# Patient Record
Sex: Female | Born: 1975 | Race: White | Hispanic: No | Marital: Married | Smoking: Former smoker
Health system: Southern US, Community
[De-identification: ages and names within clinical notes are randomized; demographics above are authoritative.]

## PROBLEM LIST (undated history)

## (undated) DIAGNOSIS — R319 Hematuria, unspecified: Secondary | ICD-10-CM

## (undated) DIAGNOSIS — M069 Rheumatoid arthritis, unspecified: Secondary | ICD-10-CM

## (undated) DIAGNOSIS — T7840XA Allergy, unspecified, initial encounter: Secondary | ICD-10-CM

## (undated) DIAGNOSIS — K9041 Non-celiac gluten sensitivity: Secondary | ICD-10-CM

## (undated) DIAGNOSIS — M1990 Unspecified osteoarthritis, unspecified site: Secondary | ICD-10-CM

## (undated) DIAGNOSIS — E039 Hypothyroidism, unspecified: Secondary | ICD-10-CM

## (undated) DIAGNOSIS — F419 Anxiety disorder, unspecified: Secondary | ICD-10-CM

## (undated) HISTORY — DX: Unspecified osteoarthritis, unspecified site: M19.90

## (undated) HISTORY — PX: ESOPHAGOGASTRODUODENOSCOPY: SHX1529

## (undated) HISTORY — DX: Anxiety disorder, unspecified: F41.9

## (undated) HISTORY — PX: WISDOM TOOTH EXTRACTION: SHX21

## (undated) HISTORY — DX: Hypothyroidism, unspecified: E03.9

## (undated) HISTORY — DX: Rheumatoid arthritis, unspecified: M06.9

## (undated) HISTORY — PX: COSMETIC SURGERY: SHX468

## (undated) HISTORY — DX: Non-celiac gluten sensitivity: K90.41

## (undated) HISTORY — DX: Allergy, unspecified, initial encounter: T78.40XA

---

## 2017-09-05 ENCOUNTER — Encounter: Payer: Self-pay | Admitting: Family Medicine

## 2017-09-05 ENCOUNTER — Ambulatory Visit: Payer: BLUE CROSS/BLUE SHIELD | Admitting: Family Medicine

## 2017-09-05 VITALS — BP 110/70 | HR 74 | Temp 98.5°F | Resp 16 | Ht 64.0 in | Wt 170.6 lb

## 2017-09-05 DIAGNOSIS — E039 Hypothyroidism, unspecified: Secondary | ICD-10-CM | POA: Diagnosis not present

## 2017-09-05 DIAGNOSIS — M069 Rheumatoid arthritis, unspecified: Secondary | ICD-10-CM | POA: Insufficient documentation

## 2017-09-05 DIAGNOSIS — F419 Anxiety disorder, unspecified: Secondary | ICD-10-CM | POA: Diagnosis not present

## 2017-09-05 MED ORDER — BUPROPION HCL ER (XL) 150 MG PO TB24: 150.0000 mg | ORAL_TABLET | Freq: Every day | ORAL | 3 refills | Status: DC

## 2017-09-05 NOTE — Assessment & Plan Note (Signed)
Stable wellbutrin refilled rto cpe

## 2017-09-05 NOTE — Progress Notes (Signed)
Patient ID: Marcia Rodgers, female    DOB: 18-Jul-1976  Age: 42 y.o. MRN: [id number]    Subjective:  Subjective  HPI Marcia Rodgers present to establish.  She needs a refill on the wellbutrin.  She also has a hx of hypothyroidism. She sees gyn.    Review of Systems  Constitutional: Negative for activity change, appetite change, fatigue and unexpected weight change.  Respiratory: Negative for cough and shortness of breath.   Cardiovascular: Negative for chest pain and palpitations.  Psychiatric/Behavioral: Negative for behavioral problems and dysphoric mood. The patient is not nervous/anxious.     History Past Medical History:  Diagnosis Date  . Allergy   . Anxiety   . Arthritis   . Gluten intolerance   . Hypothyroidism   . Rheumatoid arthritis (HCC)     She has a past surgical history that includes Cosmetic surgery.   Her family history includes Cancer in her maternal grandmother; Heart attack in her maternal grandfather; Hyperlipidemia in her maternal grandfather; Stroke in her maternal grandfather.She reports that she has quit smoking. she has never used smokeless tobacco. She reports that she drinks alcohol. She reports that she does not use drugs.  Current Outpatient Medications on File Prior to Visit  Medication Sig Dispense Refill  . drospirenone-ethinyl estradiol (LORYNA) 3-0.02 MG tablet Take 1 tablet by mouth daily.    Marland Kitchen levothyroxine (SYNTHROID, LEVOTHROID) 88 MCG tablet Take 1 tablet by mouth daily. [facility]    . valACYclovir (VALTREX) 1000 MG tablet Take 1 tablet by mouth daily.  2   No current facility-administered medications on file prior to visit.      Objective:  Objective  Physical Exam  Constitutional: She is oriented to person, place, and time. She appears well-developed and well-nourished.  HENT:  Head: Normocephalic and atraumatic.  Eyes: Conjunctivae and EOM are normal.  Neck: Normal range of motion. Neck supple. No JVD present.  Carotid bruit is not present. No thyromegaly present.  Cardiovascular: Normal rate, regular rhythm and normal heart sounds.  No murmur heard. Pulmonary/Chest: Effort normal and breath sounds normal. No respiratory distress. She has no wheezes. She has no rales. She exhibits no tenderness.  Musculoskeletal: She exhibits no edema.  Neurological: She is alert and oriented to person, place, and time.  Psychiatric: She has a normal mood and affect. Her behavior is normal. Thought content normal.  Nursing note and vitals reviewed.  BP 110/70 (BP Location: Left Arm, Cuff Size: Normal)   Pulse 74   Temp 98.5 F (36.9 C) (Oral)   Resp 16   Ht 5\' 4"  (1.626 m)   Wt 170 lb 9.6 oz (77.4 kg)   LMP 08/05/2017   SpO2 94%   BMI 29.28 kg/m  Wt Readings from Last 3 Encounters:  09/05/17 170 lb 9.6 oz (77.4 kg)     No results found for: WBC, HGB, HCT, PLT, GLUCOSE, CHOL, TRIG, HDL, LDLDIRECT, LDLCALC, ALT, AST, NA, K, CL, CREATININE, BUN, CO2, TSH, PSA, INR, GLUF, HGBA1C, MICROALBUR  Patient was never admitted.   Assessment & Plan:  Plan  I have changed 11/03/17 Marcia Rodgers's buPROPion. I am also having her maintain her drospirenone-ethinyl estradiol, levothyroxine, and valACYclovir.  Meds ordered this encounter  Medications  . buPROPion (WELLBUTRIN XL) 150 MG 24 hr tablet    Sig: Take 1 tablet (150 mg total) by mouth daily.    Dispense:  90 tablet    Refill:  3    Problem List Items  Addressed This Visit      Unprioritized   Anxiety - Primary    Stable wellbutrin refilled rto cpe      Relevant Medications   buPROPion (WELLBUTRIN XL) 150 MG 24 hr tablet   Hypothyroidism    Check tsh con't synthroid      Relevant Medications   levothyroxine (SYNTHROID, LEVOTHROID) 88 MCG tablet   Other Relevant Orders   TSH   Rheumatoid arthritis (HCC)      Follow-up: Return if symptoms worsen or fail to improve, for annual exam, fasting.  Donato Schultz, DO

## 2017-09-05 NOTE — Assessment & Plan Note (Signed)
Check tsh con't synthroid 

## 2017-09-05 NOTE — Patient Instructions (Signed)

## 2017-09-06 LAB — TSH: TSH: 1.88 u[IU]/mL (ref 0.35–4.50)

## 2017-09-08 ENCOUNTER — Encounter: Payer: Self-pay | Admitting: Family Medicine

## 2017-11-16 ENCOUNTER — Encounter: Payer: Self-pay | Admitting: Family Medicine

## 2017-11-17 NOTE — Telephone Encounter (Signed)
Her tsh was 1.88---- that is low normal range --- if we increase it she will be more hyperthyroid

## 2017-11-18 ENCOUNTER — Telehealth: Payer: Self-pay | Admitting: Family Medicine

## 2017-11-18 ENCOUNTER — Encounter: Payer: Self-pay | Admitting: Family Medicine

## 2017-11-18 MED ORDER — LEVOTHYROXINE SODIUM 88 MCG PO TABS: 88.0000 ug | ORAL_TABLET | Freq: Every day | ORAL | 1 refills | Status: DC

## 2017-11-18 NOTE — Telephone Encounter (Signed)
Levothyroxine refill clarification question from the pharmacy.  Misty from [employer] [phone] press 71.   Pharmacy needing to verify if the dr wanted the levothyroxine or the SR that had normally been filled.     Thanks.  Dr. Zola Button

## 2017-11-18 NOTE — Telephone Encounter (Signed)
Copied from CRM [id number]. Topic: Quick Communication - See Telephone Encounter >> Nov 18, 2017  9:24 AM Arlyss Gandy, NT wrote: CRM for notification. See Telephone encounter for: 11/18/17. Misty from [employer] [phone] press 71. Pharmacy needing to verify if the dr wanted the levothyroxine (SYNTHROID, LEVOTHROID) or the SR that had normally been filled.

## 2017-11-18 NOTE — Telephone Encounter (Signed)
See MyChart message from 11/16/2017.

## 2017-11-22 ENCOUNTER — Telehealth: Payer: Self-pay | Admitting: Family Medicine

## 2017-11-22 NOTE — Telephone Encounter (Signed)
Duplicate message. 

## 2017-11-22 NOTE — Telephone Encounter (Signed)
Can you please review the last email for patient.  It looks like she would like a sustained release.

## 2017-11-22 NOTE — Telephone Encounter (Signed)
Copied from CRM [id number]. Topic: Quick Communication - See Telephone Encounter >> Nov 18, 2017  9:24 AM Arlyss Gandy, NT wrote: CRM for notification. See Telephone encounter for: 11/18/17. Misty from [employer] [phone] press 71. Pharmacy needing to verify if the dr wanted the levothyroxine (SYNTHROID, LEVOTHROID) or the SR that had normally been filled.  >> Nov 22, 2017 12:29 PM Lelon Frohlich, RMA wrote: Pharmacy called back about levothyroxine  >> Nov 22, 2017  2:44 PM Rudi Coco, NT wrote: Pt. Calling back to check on med levothyroxine (SYNTHROID, LEVOTHROID) 88 MCG tablet [[id number]] .

## 2017-11-22 NOTE — Telephone Encounter (Signed)
He should stay on the one she has been taking

## 2017-11-23 MED ORDER — NONFORMULARY OR COMPOUNDED ITEM: 1 refills | Status: DC

## 2017-11-23 NOTE — Telephone Encounter (Signed)
Contacted pharmacy.  rx should say levothyroxine SR .  Its a compounded medication.  Spoke with pharmacist.  Rx fixed in list for future references.

## 2017-12-06 ENCOUNTER — Ambulatory Visit: Payer: BLUE CROSS/BLUE SHIELD | Admitting: Family Medicine

## 2017-12-06 ENCOUNTER — Encounter: Payer: Self-pay | Admitting: Family Medicine

## 2017-12-06 VITALS — BP 102/66 | HR 76 | Temp 98.5°F | Resp 16 | Ht 64.0 in | Wt 171.0 lb

## 2017-12-06 DIAGNOSIS — M0609 Rheumatoid arthritis without rheumatoid factor, multiple sites: Secondary | ICD-10-CM

## 2017-12-06 NOTE — Progress Notes (Signed)
Patient ID: Marcia Rodgers, female   DOB: 1975/09/15, 42 y.o.   MRN: [id number]     Subjective:  I acted as a Education administrator for Dr. Carollee Herter.  Guerry Bruin, [place]   Patient ID: Marcia Rodgers, female    DOB: 06-Aug-1976, 42 y.o.   MRN: [id number]  Chief Complaint  Patient presents with  . referral to rheumatology    HPI  Patient is in today for referral to rheumatologist.-- she has a hx of reactive rheumatoid arthritis ---- it usually starts in her R ankle and knee and she was seeing a rheum in [place].  She will have the records sent [facility].  She has already contacted dr Valora Piccolo office and they just need a referral.   Patient Care Team: Carollee Herter, Alferd Apa, DO as PCP - General (Family Medicine) Aggie Moats, PA-C (Obstetrics and Gynecology)   Past Medical History:  Diagnosis Date  . Allergy   . Anxiety   . Arthritis   . Gluten intolerance   . Hypothyroidism   . Rheumatoid arthritis [facility])     Past Surgical History:  Procedure Laterality Date  . COSMETIC SURGERY     breast, arms, around stomach    Family History  Problem Relation Age of Onset  . Cancer Maternal Grandmother   . Heart attack Maternal Grandfather   . Hyperlipidemia Maternal Grandfather   . Stroke Maternal Grandfather     Social History   Socioeconomic History  . Marital status: Married    Spouse name: Not on file  . Number of children: Not on file  . Years of education: Not on file  . Highest education level: Bachelor's degree (e.g., BA, AB, BS)  Occupational History  . Occupation: English as a second language teacher  Social Needs  . Financial resource strain: Not on file  . Food insecurity:    Worry: Not on file    Inability: Not on file  . Transportation needs:    Medical: Not on file    Non-medical: Not on file  Tobacco Use  . Smoking status: Former Research scientist (life sciences)  . Smokeless tobacco: Never Used  Substance and Sexual Activity  . Alcohol use: Yes  . Drug use: No  . Sexual activity: Not on file  Lifestyle  . Physical activity:      Days per week: Not on file    Minutes per session: Not on file  . Stress: Not on file  Relationships  . Social connections:    Talks on phone: Not on file    Gets together: Not on file    Attends religious service: Not on file    Active member of club or organization: Not on file    Attends meetings of clubs or organizations: Not on file    Relationship status: Not on file  . Intimate partner violence:    Fear of current or ex partner: Not on file    Emotionally abused: Not on file    Physically abused: Not on file    Forced sexual activity: Not on file  Other Topics Concern  . Not on file  Social History Narrative   Exercise-no    Outpatient Medications Prior to Visit  Medication Sig Dispense Refill  . buPROPion (WELLBUTRIN XL) 150 MG 24 hr tablet Take 1 tablet (150 mg total) by mouth daily. 90 tablet 3  . drospirenone-ethinyl estradiol (LORYNA) 3-0.02 MG tablet Take 1 tablet by mouth daily.    . NONFORMULARY OR COMPOUNDED ITEM Levothyroxine SR 73mg 90 each 1  . valACYclovir (  VALTREX) 1000 MG tablet Take 1 tablet by mouth daily.  2   No facility-administered medications prior to visit.     No Known Allergies  Review of Systems  Constitutional: Negative for chills, fever and malaise/fatigue.  HENT: Negative for congestion and hearing loss.   Eyes: Negative for discharge.  Respiratory: Negative for cough, sputum production and shortness of breath.   Cardiovascular: Positive for leg swelling. Negative for chest pain and palpitations.  Gastrointestinal: Negative for abdominal pain, blood in stool, constipation, diarrhea, heartburn, nausea and vomiting.  Genitourinary: Negative for dysuria, frequency, hematuria and urgency.  Musculoskeletal: Positive for joint pain. Negative for back pain, falls and myalgias.  Skin: Negative for rash.  Neurological: Negative for dizziness, sensory change, loss of consciousness, weakness and headaches.  Endo/Heme/Allergies: Negative for  environmental allergies. Does not bruise/bleed easily.  Psychiatric/Behavioral: Negative for depression and suicidal ideas. The patient is not nervous/anxious and does not have insomnia.        Objective:    Physical Exam  Constitutional: She is oriented to person, place, and time. She appears well-developed and well-nourished.  HENT:  Head: Normocephalic and atraumatic.  Eyes: Conjunctivae and EOM are normal.  Neck: Normal range of motion. Neck supple. No JVD present. Carotid bruit is not present. No thyromegaly present.  Cardiovascular: Normal rate, regular rhythm and normal heart sounds.  No murmur heard. Pulmonary/Chest: Effort normal and breath sounds normal. No respiratory distress. She has no wheezes. She has no rales. She exhibits no tenderness.  Musculoskeletal: She exhibits no edema.  No swelling today--- much better today per pt  Neurological: She is alert and oriented to person, place, and time.  Psychiatric: She has a normal mood and affect.  Nursing note and vitals reviewed.   BP 102/66 (Cuff Size: Normal)   Pulse 76   Temp 98.5 F (36.9 C) (Oral)   Resp 16   Ht 5' 4"  (1.626 m)   Wt 171 lb (77.6 kg)   SpO2 98%   BMI 29.35 kg/m  Wt Readings from Last 3 Encounters:  12/06/17 171 lb (77.6 kg)  09/05/17 170 lb 9.6 oz (77.4 kg)   BP Readings from Last 3 Encounters:  12/06/17 102/66  09/05/17 110/70      There is no immunization history on file for this patient.  Health Maintenance  Topic Date Due  . HIV Screening  01/09/1992  . TETANUS/TDAP  01/09/1996  . PAP SMEAR  01/08/1998  . INFLUENZA VACCINE  03/30/2028 (Originally 03/30/2018)    Lab Results  Component Value Date   TSH 1.88 09/05/2017    Lab Results  Component Value Date   TSH 1.88 09/05/2017   No results found for: WBC, HGB, HCT, MCV, PLT No results found for: NA, K, CHLORIDE, CO2, GLUCOSE, BUN, CREATININE, BILITOT, ALKPHOS, AST, ALT, PROT, ALBUMIN, CALCIUM, ANIONGAP, EGFR, GFR No results  found for: CHOL No results found for: HDL No results found for: LDLCALC No results found for: TRIG No results found for: CHOLHDL No results found for: HGBA1C       Assessment & Plan:   Problem List Items Addressed This Visit    None      I am having [place] maintain her drospirenone-ethinyl estradiol, valACYclovir, buPROPion, and NONFORMULARY OR COMPOUNDED ITEM.  No orders of the defined types were placed in this encounter.   CMA served as Education administrator during this visit. History, Physical and Plan performed by medical provider. Documentation and orders reviewed and attested to.  Kem Boroughs  [place], CMA

## 2017-12-06 NOTE — Patient Instructions (Signed)
Rheumatoid Arthritis Rheumatoid arthritis (RA) is a long-term (chronic) disease that causes inflammation in your joints. RA may start slowly. It usually affects the small joints of the hands and feet. Usually, the same joints are affected on both sides of your body. Inflammation from RA can also affect other parts of your body, including your heart, eyes, or lungs. RA is an autoimmune disease. That means that your body's defense system (immune system) mistakenly attacks healthy body tissues. There is no cure for RA, but medicines can help your symptoms and halt or slow down the progression of the disease. What are the causes? The exact cause of RA is not known. What increases the risk? This condition is more likely to develop in:  Women.  People who have a family history of RA or other autoimmune diseases.  What are the signs or symptoms? Symptoms of this condition vary from person to person. Symptoms usually start gradually. They are often worse in the morning. The first symptom may be morning stiffness that lasts longer than 30 minutes. As RA progresses, symptoms may include:  Pain, stiffness, swelling, warmth, and tenderness in joints on both sides of your body.  Loss of energy.  Loss of appetite.  Weight loss.  Low-grade fever.  Dry eyes and dry mouth.  Firm lumps (rheumatoid nodules) that grow beneath your skin in areas such as your forearm bones near your elbows and on your hands.  Changes in the appearance of joints (deformity) and loss of joint function.  Symptoms of RA often come and go. Sometimes, symptoms get worse for a period of time. These are called flares. How is this diagnosed? This condition is diagnosed based on your symptoms, medical history, and physical exam. You may have X-rays or MRI to check for the type of joint changes that are caused by RA. You may also have blood tests to look for:  Proteins (antibodies) that your immune system may make if you have RA.  They include rheumatoid factor (RF) and anti-CCP. ? When blood tests show these proteins, you are said to have "seropositive RA." ? When blood tests do not show these proteins, you may have "seronegative RA."  Inflammation in your blood.  A low number of red blood cells (anemia).  How is this treated? The goals of treatment are to relieve pain, reduce inflammation, and slow down or stop joint damage and disability. Treatment may include:  Lifestyle changes. It is important to rest, eat a healthy diet, and exercise.  Medicines. Your health care provider may adjust your medicines every 3 months until treatment goals are reached. Common medicines include: ? Pain relievers (analgesics). ? Corticosteroids and NSAIDs to reduce inflammation. ? Disease-modifying antirheumatic drugs (DMARDs) to try to slow the course of the disease. ? Biologic response modifiers to reduce inflammation and damage.  Physical therapy and occupational therapy.  Surgery, if you have severe joint damage. Joint replacement or fusing of joints may be needed.  Your health care provider will work with you to identify the best treatment option for you based on assessment of the overall disease activity in your body. Follow these instructions at home:  Take over-the-counter and prescription medicines only as told by your health care provider.  Start an exercise program as told by your health care provider.  Rest when you are having a flare.  Return to your normal activities as told by your health care provider. Ask your health care provider what activities are safe for you.  Keep all   follow-up visits as told by your health care provider. This is important. Where to find more information:  American College of Rheumatology: www.rheumatology.org  Arthritis Foundation: www.arthritis.org Contact a health care provider if:  You have a flare-up of RA symptoms.  You have a fever.  You have side effects from your  medicines. Get help right away if:  You have chest pain.  You have trouble breathing.  You quickly develop a hot, painful joint that is more severe than your usual joint aches. This information is not intended to replace advice given to you by your health care provider. Make sure you discuss any questions you have with your health care provider. Document Released: 08/13/2000 Document Revised: 01/20/2016 Document Reviewed: 05/29/2015 Elsevier Interactive Patient Education  2018 Elsevier Inc.   

## 2017-12-07 LAB — CBC WITH DIFFERENTIAL/PLATELET
BASOS ABS: 0.1 10*3/uL (ref 0.0–0.1)
Basophils Relative: 1.2 % (ref 0.0–3.0)
EOS ABS: 0.2 10*3/uL (ref 0.0–0.7)
Eosinophils Relative: 2.8 % (ref 0.0–5.0)
HEMATOCRIT: 38.7 % (ref 36.0–46.0)
HEMOGLOBIN: 13.4 g/dL (ref 12.0–15.0)
LYMPHS PCT: 34.6 % (ref 12.0–46.0)
Lymphs Abs: 2.1 10*3/uL (ref 0.7–4.0)
MCHC: 34.7 g/dL (ref 30.0–36.0)
MCV: 86.3 fl (ref 78.0–100.0)
Monocytes Absolute: 0.5 10*3/uL (ref 0.1–1.0)
Monocytes Relative: 8.4 % (ref 3.0–12.0)
Neutro Abs: 3.3 10*3/uL (ref 1.4–7.7)
Neutrophils Relative %: 53 % (ref 43.0–77.0)
Platelets: 324 10*3/uL (ref 150.0–400.0)
RBC: 4.49 Mil/uL (ref 3.87–5.11)
RDW: 13 % (ref 11.5–15.5)
WBC: 6.2 10*3/uL (ref 4.0–10.5)

## 2017-12-07 LAB — HIGH SENSITIVITY CRP: CRP, High Sensitivity: 1.69 mg/L (ref 0.000–5.000)

## 2017-12-07 LAB — COMPREHENSIVE METABOLIC PANEL
ALBUMIN: 3.9 g/dL (ref 3.5–5.2)
ALK PHOS: 45 U/L (ref 39–117)
ALT: 18 U/L (ref 0–35)
AST: 13 U/L (ref 0–37)
BUN: 19 mg/dL (ref 6–23)
CALCIUM: 9.1 mg/dL (ref 8.4–10.5)
CO2: 30 meq/L (ref 19–32)
CREATININE: 1.08 mg/dL (ref 0.40–1.20)
Chloride: 102 mEq/L (ref 96–112)
GFR: 59.45 mL/min — ABNORMAL LOW (ref >60.00)
Glucose, Bld: 94 mg/dL (ref 70–99)
Potassium: 4.2 mEq/L (ref 3.5–5.1)
Sodium: 137 mEq/L (ref 135–145)
Total Bilirubin: 0.2 mg/dL (ref 0.2–1.2)
Total Protein: 7.1 g/dL (ref 6.0–8.3)

## 2017-12-07 LAB — SEDIMENTATION RATE: Sed Rate: 7 mm/hr (ref 0–20)

## 2017-12-11 LAB — ANTI-NUCLEAR AB-TITER (ANA TITER)

## 2017-12-11 LAB — ANA: ANA: POSITIVE — AB

## 2017-12-11 LAB — RHEUMATOID FACTOR: Rhuematoid fact SerPl-aCnc: <14 IU/mL (ref <14)

## 2018-06-14 ENCOUNTER — Telehealth: Payer: Self-pay | Admitting: Family Medicine

## 2018-06-14 MED ORDER — NONFORMULARY OR COMPOUNDED ITEM: 1 refills | Status: DC

## 2018-06-14 NOTE — Telephone Encounter (Signed)
Copied from CRM [id number]. Topic: Quick Communication - Rx Refill/Question >> Jun 14, 2018  9:58 AM Gaynelle Adu wrote: Medication: levothyroxine (SYNTHROID, LEVOTHROID) 88 MCG tablet  Has the patient contacted their pharmacy? Yes    Preferred Pharmacy (with phone number or street name): [facility] - [place], [place] - [address] [phone] (Phone) [phone] (Fax)  Patient is requesting a 90 day supply

## 2018-06-14 NOTE — Telephone Encounter (Signed)
Patient notified that rx was faxed.

## 2018-07-30 ENCOUNTER — Other Ambulatory Visit: Payer: Self-pay | Admitting: Family Medicine

## 2018-07-30 DIAGNOSIS — F419 Anxiety disorder, unspecified: Secondary | ICD-10-CM

## 2018-08-02 ENCOUNTER — Telehealth: Payer: Self-pay

## 2018-08-02 NOTE — Telephone Encounter (Signed)
Received PA for bupropion HCl ER 150mg  tablets- however Pt does not have active pharmacy benefits that I could locate.

## 2018-08-03 NOTE — Telephone Encounter (Signed)
Please leave message for [facility]

## 2018-08-03 NOTE — Telephone Encounter (Signed)
Patients last name has changed to [place]. That may be the reason why we cannot find info in computer.  Patient will come by to update her info.  Pharmacy resending prior [employer].

## 2018-08-07 NOTE — Telephone Encounter (Signed)
Pt states that the pharmacy states they have not received PA info from the office. Please advise.

## 2018-08-08 NOTE — Telephone Encounter (Signed)
Medication approved, called pharmacy and they stated that it went through for 30 tabs.  Left message on patients vm that medication is approved.  Approvedtoday  CaseId:[id number];Status:Approved;Review Type:Prior Auth;Coverage Start Date:07/09/2018;Coverage End Date:08/07/2021;  Key [id number]

## 2018-08-31 ENCOUNTER — Telehealth: Payer: Self-pay

## 2018-08-31 NOTE — Telephone Encounter (Signed)
TOC appt scheduled for 09/14/18

## 2018-08-31 NOTE — Telephone Encounter (Signed)
OK w me.  

## 2018-08-31 NOTE — Telephone Encounter (Signed)
Okay to schedule NP appt w/ Dr. [facility].  

## 2018-08-31 NOTE — Telephone Encounter (Signed)
Copied from CRM [id number]. Topic: Appointment Scheduling - Transfer of Care >> Aug 31, 2018 12:18 PM Floria Raveling A wrote: Pt is requesting to transfer FROM: [place]  Pt is requesting to transfer TO: [facility]  Reason for requested transfer: pt husband goes to dr Carmelia Roller and would like to go to the same provider.  She stated she has a lot of medical issues   Best number  [phone]  Send CRM to patient's current PCP (transferring FROM).

## 2018-08-31 NOTE — Telephone Encounter (Signed)
Please advise 

## 2018-08-31 NOTE — Telephone Encounter (Signed)
Ok with me 

## 2018-09-14 ENCOUNTER — Encounter: Payer: Self-pay | Admitting: Family Medicine

## 2018-09-14 ENCOUNTER — Ambulatory Visit: Payer: BLUE CROSS/BLUE SHIELD | Admitting: Family Medicine

## 2018-09-14 VITALS — BP 112/72 | HR 91 | Temp 98.1°F | Ht 64.0 in | Wt 168.5 lb

## 2018-09-14 DIAGNOSIS — E039 Hypothyroidism, unspecified: Secondary | ICD-10-CM | POA: Diagnosis not present

## 2018-09-14 DIAGNOSIS — F419 Anxiety disorder, unspecified: Secondary | ICD-10-CM | POA: Diagnosis not present

## 2018-09-14 DIAGNOSIS — L659 Nonscarring hair loss, unspecified: Secondary | ICD-10-CM | POA: Diagnosis not present

## 2018-09-14 LAB — TSH: TSH: 2.2 u[IU]/mL (ref 0.35–4.50)

## 2018-09-14 LAB — IBC PANEL
IRON: 61 ug/dL (ref 42–145)
Saturation Ratios: 16.7 % — ABNORMAL LOW (ref 20.0–50.0)
TRANSFERRIN: 261 mg/dL (ref 212.0–360.0)

## 2018-09-14 LAB — T4, FREE: Free T4: 0.97 ng/dL (ref 0.60–1.60)

## 2018-09-14 LAB — FERRITIN: FERRITIN: 49 ng/mL (ref 10.0–291.0)

## 2018-09-14 MED ORDER — BUPROPION HCL ER (XL) 150 MG PO TB24: 150.0000 mg | ORAL_TABLET | Freq: Every day | ORAL | 3 refills | Status: DC

## 2018-09-14 MED ORDER — NONFORMULARY OR COMPOUNDED ITEM: 3 refills | Status: DC

## 2018-09-14 NOTE — Patient Instructions (Addendum)
Give us 2-3 business days to get the results of your labs back.  Let us know if you need anything.  

## 2018-09-14 NOTE — Progress Notes (Signed)
Chief Complaint  Patient presents with  . New Patient (Initial Visit)    Subjective: Patient is a 43 y.o. female here for hypothyroid f/u.  Reports compliance with medication. Current symptoms include: fatigue, wt gain, hair thinning She believes her dose should be increased  Having hair thinning.  Has tried biotin in the past.  Is wondering whether it is due to thyroid issues or something else.  ROS:  Endocrine: As noted in HPI  Past Medical History:  Diagnosis Date  . Allergy   . Anxiety   . Arthritis   . Gluten intolerance   . Hypothyroidism   . Rheumatoid arthritis (HCC)     Objective: BP 112/72 (BP Location: Left Arm, Patient Position: Sitting, Cuff Size: Normal)   Pulse 91   Temp 98.1 F (36.7 C) (Oral)   Ht 5\' 4"  (1.626 m)   Wt 168 lb 8 oz (76.4 kg)   SpO2 97%   BMI 28.92 kg/m  General: Awake, appears stated age HEENT: MMM, EOMi  Heart: RRR, no lower extremity edema Neck: Symmetric, no masses or nodules noted Lungs: CTAB, no rales, wheezes or rhonchi. No accessory muscle use Psych: Age appropriate judgment and insight, normal affect and mood  Assessment and Plan: Hypothyroidism, unspecified type - Plan: TSH, IBC panel  Hair thinning - Plan: Ferritin, T4, free  Anxiety - Plan: buPROPion (WELLBUTRIN XL) 150 MG 24 hr tablet  Orders as above. She goes to a compounding pharmacy that is able to make off label dosing of the Synthroid.  She has been increased by 5-7 mcg in the past and would like to try this again.  We will check labs today and again in 6 weeks. The patient voiced understanding and agreement to the plan.  Jilda Roche [place], DO 09/14/18  11:43 AM

## 2018-09-28 ENCOUNTER — Encounter: Payer: Self-pay | Admitting: Family Medicine

## 2018-09-28 ENCOUNTER — Other Ambulatory Visit: Payer: Self-pay | Admitting: Family Medicine

## 2018-09-28 MED ORDER — NONFORMULARY OR COMPOUNDED ITEM: 3 refills | Status: DC

## 2018-11-02 ENCOUNTER — Ambulatory Visit: Payer: BLUE CROSS/BLUE SHIELD | Admitting: Family Medicine

## 2018-12-18 ENCOUNTER — Ambulatory Visit (INDEPENDENT_AMBULATORY_CARE_PROVIDER_SITE_OTHER): Payer: BLUE CROSS/BLUE SHIELD | Admitting: Family Medicine

## 2018-12-18 ENCOUNTER — Other Ambulatory Visit: Payer: Self-pay

## 2018-12-18 ENCOUNTER — Encounter: Payer: Self-pay | Admitting: Family Medicine

## 2018-12-18 DIAGNOSIS — E039 Hypothyroidism, unspecified: Secondary | ICD-10-CM | POA: Diagnosis not present

## 2018-12-18 NOTE — Progress Notes (Signed)
CC: Thyroid f/u  Subjective: Patient is a 43 y.o. female here for thyroid f/u. Due to outbreak, we are interacting via web portal for an electronic face-to-face visit. I verified patient's ID using 2 identifiers.   Seen in Jan of this year. Had some issues with hypothyroid s/s's so there was a small increase in her supp thyroid medication from 88 to 94 mcg. She did not do well on the initial change, so went down to 91 mcg. Fatigue is significantly improved as well as hair loss issue. Still having though. If thyroid levels in nml range, would be OK staying at dose but would be interested in trying 94 mcg/d if on lower limit of normal.    ROS: Endo: As noted in HPI  Past Medical History:  Diagnosis Date  . Allergy   . Anxiety   . Arthritis   . Gluten intolerance   . Hypothyroidism   . Rheumatoid arthritis (HCC)     Objective: No conversational dyspnea Age appropriate judgment and insight Nml affect and mood  Assessment and Plan: Hypothyroidism, unspecified type - Plan: T4, free, TSH  Check labs. Will maintain on 91 mcg/ if WNL, if on lower limits of nml, will increase to 94 mcg/d.  F/u in 6 weeks if dosage change, 6 mo if we leave the same.  The patient voiced understanding and agreement to the plan.  Jilda Roche [place], DO 12/18/18  8:00 AM

## 2019-01-12 ENCOUNTER — Other Ambulatory Visit: Payer: Self-pay | Admitting: Family Medicine

## 2019-01-12 ENCOUNTER — Encounter: Payer: Self-pay | Admitting: Family Medicine

## 2019-01-12 DIAGNOSIS — F419 Anxiety disorder, unspecified: Secondary | ICD-10-CM

## 2019-01-12 MED ORDER — BUPROPION HCL ER (XL) 150 MG PO TB24: 150.0000 mg | ORAL_TABLET | Freq: Every day | ORAL | 1 refills | Status: DC

## 2019-01-12 MED ORDER — NONFORMULARY OR COMPOUNDED ITEM: 3 refills | Status: DC

## 2019-01-12 NOTE — Telephone Encounter (Signed)
Does this prescription get printed out as given here in order?

## 2019-01-12 NOTE — Telephone Encounter (Signed)
Printed. Needs labs. Ty.

## 2019-01-12 NOTE — Telephone Encounter (Signed)
This pt transferred to [facility]

## 2019-01-15 ENCOUNTER — Other Ambulatory Visit: Payer: Self-pay

## 2019-01-15 ENCOUNTER — Other Ambulatory Visit (INDEPENDENT_AMBULATORY_CARE_PROVIDER_SITE_OTHER): Payer: BLUE CROSS/BLUE SHIELD

## 2019-01-15 DIAGNOSIS — E039 Hypothyroidism, unspecified: Secondary | ICD-10-CM

## 2019-01-15 LAB — TSH: TSH: 1.83 u[IU]/mL (ref 0.35–4.50)

## 2019-01-15 LAB — T4, FREE: Free T4: 0.92 ng/dL (ref 0.60–1.60)

## 2019-01-16 ENCOUNTER — Other Ambulatory Visit: Payer: Self-pay | Admitting: Family Medicine

## 2019-01-16 MED ORDER — NONFORMULARY OR COMPOUNDED ITEM: 0 refills | Status: DC

## 2019-02-21 ENCOUNTER — Encounter: Payer: Self-pay | Admitting: Family Medicine

## 2019-02-23 ENCOUNTER — Other Ambulatory Visit: Payer: Self-pay

## 2019-02-23 ENCOUNTER — Other Ambulatory Visit: Payer: BC Managed Care – PPO

## 2019-02-23 ENCOUNTER — Ambulatory Visit (INDEPENDENT_AMBULATORY_CARE_PROVIDER_SITE_OTHER): Payer: BC Managed Care – PPO | Admitting: Family Medicine

## 2019-02-23 ENCOUNTER — Encounter: Payer: Self-pay | Admitting: Family Medicine

## 2019-02-23 ENCOUNTER — Telehealth: Payer: Self-pay | Admitting: Family Medicine

## 2019-02-23 DIAGNOSIS — Z20822 Contact with and (suspected) exposure to covid-19: Secondary | ICD-10-CM

## 2019-02-23 DIAGNOSIS — Z20828 Contact with and (suspected) exposure to other viral communicable diseases: Secondary | ICD-10-CM | POA: Diagnosis not present

## 2019-02-23 NOTE — Progress Notes (Signed)
Chief Complaint  Patient presents with  . Follow-up    Covid Testing    Subjective: Patient is a 43 y.o. female here for COVID-19 exposure. Due to COVID-19 pandemic, we are interacting via web portal for an electronic face-to-face visit. I verified patient's ID using 2 identifiers. Patient agreed to proceed with visit via this method. Patient is at home, I am at office. Patient and I are present for visit.   6/19 the patient was exposed to an asymptomatic individual who ended up testing + for COVID-19. The individual tested positive the next day after spiking a fever. Pt is asymptomatic. Told she needs a letter stating she can return on 7/6 and a COVID-19 test. Husband needs letter as well.   ROS: Lungs: Denies SOB   Past Medical History:  Diagnosis Date  . Allergy   . Anxiety   . Arthritis   . Gluten intolerance   . Hypothyroidism   . Rheumatoid arthritis (HCC)     Objective: No conversational dyspnea Age appropriate judgment and insight Nml affect and mood  Assessment and Plan: Exposure to Covid-19 Virus - Plan: test, letter written, warnings about development of s/s's given.   F/u as originally scheduled.  The patient voiced understanding and agreement to the plan.  [place], DO 02/23/19  9:00 AM

## 2019-02-23 NOTE — Telephone Encounter (Signed)
Please schedule for testing 

## 2019-02-23 NOTE — Telephone Encounter (Signed)
Patient scheduled for today at 12:45 pm at [facility].  Testing protocol reviewed.

## 2019-02-27 ENCOUNTER — Encounter: Payer: Self-pay | Admitting: Family Medicine

## 2019-02-27 ENCOUNTER — Other Ambulatory Visit: Payer: Self-pay

## 2019-02-27 ENCOUNTER — Ambulatory Visit: Payer: BC Managed Care – PPO | Admitting: Family Medicine

## 2019-02-27 VITALS — BP 120/80 | HR 79 | Temp 98.3°F | Resp 16 | Ht 64.0 in | Wt 165.0 lb

## 2019-02-27 DIAGNOSIS — E039 Hypothyroidism, unspecified: Secondary | ICD-10-CM | POA: Diagnosis not present

## 2019-02-27 DIAGNOSIS — F419 Anxiety disorder, unspecified: Secondary | ICD-10-CM

## 2019-02-27 DIAGNOSIS — L659 Nonscarring hair loss, unspecified: Secondary | ICD-10-CM | POA: Diagnosis not present

## 2019-02-27 LAB — TSH: TSH: 2.81 u[IU]/mL (ref 0.35–4.50)

## 2019-02-27 LAB — IBC + FERRITIN
Ferritin: 42.4 ng/mL (ref 10.0–291.0)
Iron: 83 ug/dL (ref 42–145)
Saturation Ratios: 22.7 % (ref 20.0–50.0)
Transferrin: 261 mg/dL (ref 212.0–360.0)

## 2019-02-27 LAB — T4, FREE: Free T4: 0.81 ng/dL (ref 0.60–1.60)

## 2019-02-27 MED ORDER — BUPROPION HCL ER (SR) 200 MG PO TB12: 200.0000 mg | ORAL_TABLET | Freq: Two times a day (BID) | ORAL | 2 refills | Status: DC

## 2019-02-27 NOTE — Progress Notes (Signed)
Chief Complaint  Patient presents with  . Hypothyroidism    follow up    Subjective: Patient is a 43 y.o. female here for thyroid f/u.  Hypothyroidism Patient presents for follow-up of hypothyroidism.  Reports compliance with medication. Current symptoms include: fluctuating bowel movements only s/s's Denies: denies fatigue, weight changes, heat/cold intolerance, skin changes or CVS symptoms She believes her dose should be unchanged  Hair thinning This continues, though at a lesser rate. Her ferritin was found to be on the lower side of normal in the past and she was rec'd to take PO iron. She has not been compliant with this. No new topicals/hair products.  Anxiety Situation w pandemic has increased her anxiety. She was started back on bupropion 150 mg/d around 6 weeks ago. Reports feeling better, wishes she could increase the dose. No AE's, reports compliance.   ROS: Endo: No wt changes Psych: +anxiety  Past Medical History:  Diagnosis Date  . Allergy   . Anxiety   . Arthritis   . Gluten intolerance   . Hypothyroidism   . Rheumatoid arthritis (HCC)     Objective: BP 120/80 (BP Location: Left Arm, Patient Position: Sitting, Cuff Size: Normal)   Pulse 79   Temp 98.3 F (36.8 C) (Oral)   Resp 16   Ht 5\' 4"  (1.626 m)   Wt 165 lb (74.8 kg)   SpO2 99%   BMI 28.32 kg/m  General: Awake, appears stated age HEENT: EOMi Neck: Symmetric, supple, no thyromegaly Heart: RRR, no murmurs Lungs: CTAB, no rales, wheezes or rhonchi. No accessory muscle use Psych: Age appropriate judgment and insight, normal affect and mood  Assessment and Plan: Hypothyroidism, unspecified type - Plan: T4, free, TSH  Hair thinning - Plan: IBC + Ferritin  Anxiety - Increase dose of bupropion to 200 mg bid from 150 mg.   Orders as above. If on lower 1/2 of normal, will want to increase dose to 97 mcg. F/u pending results.  The patient voiced understanding and agreement to the  plan.  [place], DO 02/27/19  10:54 AM

## 2019-02-27 NOTE — Patient Instructions (Addendum)
Download [employer] as an Print production planner. It is also a web site.   Give [place] 2-3 business days to get the results of your labs back.    Let us know if you need anything.

## 2019-02-28 ENCOUNTER — Encounter: Payer: Self-pay | Admitting: Family Medicine

## 2019-03-01 LAB — NOVEL CORONAVIRUS, NAA: SARS-CoV-2, NAA: NOT DETECTED

## 2019-03-05 ENCOUNTER — Other Ambulatory Visit: Payer: Self-pay | Admitting: Family Medicine

## 2019-03-05 MED ORDER — NONFORMULARY OR COMPOUNDED ITEM: 0 refills | Status: DC

## 2019-04-16 ENCOUNTER — Other Ambulatory Visit: Payer: Self-pay

## 2019-04-17 ENCOUNTER — Other Ambulatory Visit: Payer: Self-pay | Admitting: Family Medicine

## 2019-04-17 ENCOUNTER — Encounter: Payer: Self-pay | Admitting: Family Medicine

## 2019-04-17 ENCOUNTER — Ambulatory Visit: Payer: BC Managed Care – PPO | Admitting: Family Medicine

## 2019-04-17 VITALS — BP 102/68 | HR 94 | Temp 97.1°F | Ht 64.0 in | Wt 167.0 lb

## 2019-04-17 DIAGNOSIS — L659 Nonscarring hair loss, unspecified: Secondary | ICD-10-CM | POA: Diagnosis not present

## 2019-04-17 DIAGNOSIS — E039 Hypothyroidism, unspecified: Secondary | ICD-10-CM | POA: Diagnosis not present

## 2019-04-17 LAB — TSH: TSH: 3.04 u[IU]/mL (ref 0.35–4.50)

## 2019-04-17 LAB — T4, FREE: Free T4: 0.92 ng/dL (ref 0.60–1.60)

## 2019-04-17 MED ORDER — LEVOTHYROXINE SODIUM 100 MCG PO TABS: 100.0000 ug | ORAL_TABLET | Freq: Every day | ORAL | 3 refills | Status: DC

## 2019-04-17 NOTE — Patient Instructions (Addendum)
Consider adding MiraLAX or Colace to take with the iron. I think that could help your GI issues.   Give [place] 2-3 business days to get the results of your labs back. If you are on the lower end of normal, we will increase your dose to 100 mcg/d.   Let us know if you need anything.

## 2019-04-17 NOTE — Progress Notes (Signed)
Chief Complaint  Patient presents with  . Follow-up    Subjective: Patient is a 43 y.o. female here for thyroid f/u.  Reports compliance with medication- Synthroid 97 mcg/d (specially compounded). Current symptoms include: fatigue, losing hair, constipation and anxiousness Denies: weight gain, feeling cold and cold intolerance, swelling, feeling excessive energy, tremulousness, palpitations, sweating and weight loss She believes her dose should be increased We had been increasing at 3-4 mcg increments due to hyperthyroid issues with higher dosing.   Still having hair loss. Unable to take daily Fe supp 2/2 constipation.   ROS: GI: +constipation  Past Medical History:  Diagnosis Date  . Allergy   . Anxiety   . Arthritis   . Gluten intolerance   . Hypothyroidism   . Rheumatoid arthritis (HCC)     Objective: BP 102/68 (BP Location: Left Arm, Patient Position: Sitting, Cuff Size: Normal)   Pulse 94   Temp (!) 97.1 F (36.2 C) (Oral)   Ht 5\' 4"  (1.626 m)   Wt 167 lb (75.8 kg)   SpO2 98%   BMI 28.67 kg/m  General: Awake, appears stated age HEENT: MMM, EOMi Heart: RRR, no murmurs Lungs: CTAB, no rales, wheezes or rhonchi. No accessory muscle use Abd: BS+, S, NT, ND Psych: Age appropriate judgment and insight, normal affect and mood  Assessment and Plan: Hypothyroidism, unspecified type - Plan: T4, free, TSH; will increase dosage if range is safe  Hair thinning - Plan: try taking MiraLAX or Colace w Fe supp, try to get daily supp in.   Reck thyroid in 7 weeks. The patient voiced understanding and agreement to the plan.  [place], DO 04/17/19  8:22 AM

## 2019-04-19 ENCOUNTER — Encounter: Payer: Self-pay | Admitting: Family Medicine

## 2019-04-20 ENCOUNTER — Other Ambulatory Visit: Payer: Self-pay | Admitting: Family Medicine

## 2019-04-23 ENCOUNTER — Other Ambulatory Visit: Payer: Self-pay | Admitting: Family Medicine

## 2019-04-23 MED ORDER — NONFORMULARY OR COMPOUNDED ITEM: 1 refills | Status: DC

## 2019-06-04 ENCOUNTER — Other Ambulatory Visit: Payer: Self-pay

## 2019-06-05 ENCOUNTER — Other Ambulatory Visit: Payer: Self-pay

## 2019-06-05 ENCOUNTER — Encounter: Payer: Self-pay | Admitting: Family Medicine

## 2019-06-05 ENCOUNTER — Ambulatory Visit (INDEPENDENT_AMBULATORY_CARE_PROVIDER_SITE_OTHER): Payer: BC Managed Care – PPO | Admitting: Family Medicine

## 2019-06-05 VITALS — BP 110/72 | HR 91 | Temp 96.2°F | Ht 64.0 in | Wt 170.0 lb

## 2019-06-05 DIAGNOSIS — L659 Nonscarring hair loss, unspecified: Secondary | ICD-10-CM | POA: Diagnosis not present

## 2019-06-05 DIAGNOSIS — E039 Hypothyroidism, unspecified: Secondary | ICD-10-CM

## 2019-06-05 LAB — TSH: TSH: 3.82 u[IU]/mL (ref 0.35–4.50)

## 2019-06-05 LAB — IBC + FERRITIN
Ferritin: 52.5 ng/mL (ref 10.0–291.0)
Iron: 69 ug/dL (ref 42–145)
Saturation Ratios: 19.6 % — ABNORMAL LOW (ref 20.0–50.0)
Transferrin: 252 mg/dL (ref 212.0–360.0)

## 2019-06-05 LAB — T4, FREE: Free T4: 0.88 ng/dL (ref 0.60–1.60)

## 2019-06-05 MED ORDER — BUPROPION HCL ER (SR) 200 MG PO TB12: 200.0000 mg | ORAL_TABLET | Freq: Two times a day (BID) | ORAL | 2 refills | Status: DC

## 2019-06-05 NOTE — Patient Instructions (Signed)
Give us 2-3 business days to get the results of your labs back.   Keep the diet clean and stay active.  Aim to do some physical exertion for 150 minutes per week. This is typically divided into 5 days per week, 30 minutes per day. The activity should be enough to get your heart rate up. Anything is better than nothing if you have time constraints.  Let us know if you need anything.  

## 2019-06-05 NOTE — Progress Notes (Signed)
Chief Complaint  Patient presents with  . Follow-up    Subjective: Patient is a 43 y.o. female here for hypothyroid f/u.  Hypothyroidism Patient presents for follow-up of hypothyroidism.  Reports compliance with medication- 100 mcg/Synthroid. Current symptoms include: losing hair Denies: denies fatigue, weight changes, heat/cold intolerance, bowel/skin changes or CVS symptoms She believes her dose should be unchanged  Still losing hair. Taking MV w Fe in it. Able to tolerate. Did note that mom has thin hair also.   ROS: Endo: As noted in HPI  Past Medical History:  Diagnosis Date  . Allergy   . Anxiety   . Arthritis   . Gluten intolerance   . Hypothyroidism   . Rheumatoid arthritis (HCC)     Objective: BP 110/72 (BP Location: Left Arm, Patient Position: Sitting, Cuff Size: Normal)   Pulse 91   Temp (!) 96.2 F (35.7 C) (Temporal)   Ht 5\' 4"  (1.626 m)   Wt 170 lb (77.1 kg)   SpO2 98%   BMI 29.18 kg/m  General: Awake, appears stated age HEENT: MMM, EOMi Heart: RRR, no LE edema Neck: No asymmetry, nodules, enlargement of thyroid Lungs: CTAB, no rales, wheezes or rhonchi. No accessory muscle use Skin: No hairless patches, no areas of alopecia Psych: Age appropriate judgment and insight, normal affect and mood  Assessment and Plan: Hypothyroidism, unspecified type - Plan: TSH, T4, free  Hair thinning - Plan: IBC + Ferritin  If T4 WNL, will keep dose at 100 mcg/d.  F/u in 6 mo for CPE or prn.  The patient voiced understanding and agreement to the plan.  [place], DO 06/05/19  8:11 AM

## 2019-06-06 ENCOUNTER — Other Ambulatory Visit: Payer: Self-pay | Admitting: Family Medicine

## 2019-06-06 ENCOUNTER — Encounter: Payer: Self-pay | Admitting: Family Medicine

## 2019-06-06 MED ORDER — NONFORMULARY OR COMPOUNDED ITEM: 1 refills | Status: DC

## 2019-09-12 ENCOUNTER — Other Ambulatory Visit: Payer: Self-pay

## 2019-09-12 ENCOUNTER — Encounter: Payer: Self-pay | Admitting: Family Medicine

## 2019-09-12 ENCOUNTER — Ambulatory Visit (INDEPENDENT_AMBULATORY_CARE_PROVIDER_SITE_OTHER): Payer: BC Managed Care – PPO | Admitting: Family Medicine

## 2019-09-12 DIAGNOSIS — E039 Hypothyroidism, unspecified: Secondary | ICD-10-CM

## 2019-09-12 MED ORDER — NONFORMULARY OR COMPOUNDED ITEM: 2 refills | Status: DC

## 2019-09-12 NOTE — Progress Notes (Signed)
Chief Complaint  Patient presents with  . Medication Refill    thyroid med refill///needs labs today    Subjective: Patient is a 44 y.o. female here for hypothyroidism f/u. Due to COVID-19 pandemic, we are interacting via web portal for an electronic face-to-face visit. I verified patient's ID using 2 identifiers. Patient agreed to proceed with visit via this method. Patient is at home, I am at office. Patient and I are present for visit.   Hypothyroidism Reports compliance with medication- Synthroid 100 mcg/d. Denies: denies fatigue, weight changes, heat/cold intolerance, bowel/skin changes or CVS symptoms She believes her dose should be unchanged  ROS: Endo: No wt loss  Past Medical History:  Diagnosis Date  . Allergy   . Anxiety   . Arthritis   . Gluten intolerance   . Hypothyroidism   . Rheumatoid arthritis (HCC)     Objective: No conversational dyspnea Age appropriate judgment and insight Nml affect and mood  Assessment and Plan: Hypothyroidism, unspecified type - Plan: TSH, T4, free  Refill med, f/u on labs. F/u in 6 mo for CPE or prn.  The patient voiced understanding and agreement to the plan.  Jilda Roche [place], DO 09/12/19  8:41 AM

## 2019-09-18 ENCOUNTER — Other Ambulatory Visit: Payer: Self-pay

## 2019-09-18 ENCOUNTER — Other Ambulatory Visit (INDEPENDENT_AMBULATORY_CARE_PROVIDER_SITE_OTHER): Payer: BC Managed Care – PPO

## 2019-09-18 DIAGNOSIS — E039 Hypothyroidism, unspecified: Secondary | ICD-10-CM

## 2019-09-18 LAB — T4, FREE: Free T4: 0.94 ng/dL (ref 0.60–1.60)

## 2019-09-18 LAB — TSH: TSH: 3.94 u[IU]/mL (ref 0.35–4.50)

## 2019-12-04 ENCOUNTER — Other Ambulatory Visit: Payer: Self-pay

## 2019-12-05 ENCOUNTER — Ambulatory Visit (INDEPENDENT_AMBULATORY_CARE_PROVIDER_SITE_OTHER): Payer: BC Managed Care – PPO | Admitting: Family Medicine

## 2019-12-05 ENCOUNTER — Encounter: Payer: Self-pay | Admitting: Family Medicine

## 2019-12-05 ENCOUNTER — Other Ambulatory Visit: Payer: Self-pay

## 2019-12-05 VITALS — BP 110/72 | HR 81 | Temp 96.7°F | Ht 64.0 in | Wt 161.2 lb

## 2019-12-05 DIAGNOSIS — E039 Hypothyroidism, unspecified: Secondary | ICD-10-CM

## 2019-12-05 DIAGNOSIS — Z0000 Encounter for general adult medical examination without abnormal findings: Secondary | ICD-10-CM

## 2019-12-05 LAB — COMPREHENSIVE METABOLIC PANEL
ALT: 12 U/L (ref 0–35)
AST: 11 U/L (ref 0–37)
Albumin: 3.8 g/dL (ref 3.5–5.2)
Alkaline Phosphatase: 67 U/L (ref 39–117)
BUN: 18 mg/dL (ref 6–23)
CO2: 26 mEq/L (ref 19–32)
Calcium: 8.9 mg/dL (ref 8.4–10.5)
Chloride: 105 mEq/L (ref 96–112)
Creatinine, Ser: 0.94 mg/dL (ref 0.40–1.20)
GFR: 65.02 mL/min (ref >60.00)
Glucose, Bld: 82 mg/dL (ref 70–99)
Potassium: 4.3 mEq/L (ref 3.5–5.1)
Sodium: 139 mEq/L (ref 135–145)
Total Bilirubin: 0.3 mg/dL (ref 0.2–1.2)
Total Protein: 7 g/dL (ref 6.0–8.3)

## 2019-12-05 LAB — LIPID PANEL
Cholesterol: 193 mg/dL (ref 0–200)
HDL: 66.4 mg/dL (ref >39.00)
LDL Cholesterol: 106 mg/dL — ABNORMAL HIGH (ref 0–99)
NonHDL: 126.29
Total CHOL/HDL Ratio: 3
Triglycerides: 103 mg/dL (ref 0.0–149.0)
VLDL: 20.6 mg/dL (ref 0.0–40.0)

## 2019-12-05 LAB — CBC
HCT: 38.3 % (ref 36.0–46.0)
Hemoglobin: 13 g/dL (ref 12.0–15.0)
MCHC: 33.8 g/dL (ref 30.0–36.0)
MCV: 84.9 fl (ref 78.0–100.0)
Platelets: 379 10*3/uL (ref 150.0–400.0)
RBC: 4.51 Mil/uL (ref 3.87–5.11)
RDW: 13.7 % (ref 11.5–15.5)
WBC: 7 10*3/uL (ref 4.0–10.5)

## 2019-12-05 LAB — TSH: TSH: 2.94 u[IU]/mL (ref 0.35–4.50)

## 2019-12-05 LAB — T4, FREE: Free T4: 0.93 ng/dL (ref 0.60–1.60)

## 2019-12-05 MED ORDER — NONFORMULARY OR COMPOUNDED ITEM: 3 refills | Status: DC

## 2019-12-05 MED ORDER — BUPROPION HCL ER (SR) 200 MG PO TB12: 200.0000 mg | ORAL_TABLET | Freq: Two times a day (BID) | ORAL | 2 refills | Status: DC

## 2019-12-05 NOTE — Progress Notes (Signed)
Chief Complaint  Patient presents with  . Annual Exam     Well Woman Marcia Rodgers is here for a complete physical.   Her last physical was >1 year ago.  Current diet: in general, a "not good" diet. Current exercise: walking, elliptical machine. Weight is down a little and she denies daytime fatigue. Seatbelt? Yes  Health Maintenance Pap/HPV- Yes Mammogram- No Tetanus- No HIV screening- Yes  Past Medical History:  Diagnosis Date  . Allergy   . Anxiety   . Arthritis   . Gluten intolerance   . Hypothyroidism   . Rheumatoid arthritis [facility])      Past Surgical History:  Procedure Laterality Date  . COSMETIC SURGERY     breast, arms, around stomach    Medications  Current Outpatient Medications on File Prior to Visit  Medication Sig Dispense Refill  . drospirenone-ethinyl estradiol (LORYNA) 3-0.02 MG tablet Take 1 tablet by mouth daily.      Allergies Allergies  Allergen Reactions  . Gluten Meal Other (See Comments)    Review of Systems: Constitutional:  no unexpected weight changes Eye:  no recent significant change in vision Ear/Nose/Mouth/Throat:  Ears:  no recent change in hearing Nose/Mouth/Throat:  no complaints of nasal congestion, no sore throat Cardiovascular: no chest pain Respiratory:  no shortness of breath Gastrointestinal:  no abdominal pain, no change in bowel habits GU:  Female: negative for dysuria or pelvic pain Musculoskeletal/Extremities:  no pain of the joints Integumentary (Skin/Breast):  no abnormal skin lesions reported Neurologic:  no headaches Endocrine:  denies fatigue Hematologic/Lymphatic:  No areas of easy bleeding  Exam BP 110/72 (BP Location: Left Arm, Patient Position: Sitting, Cuff Size: Normal)   Pulse 81   Temp (!) 96.7 F (35.9 C) (Temporal)   Ht 5\' 4"  (1.626 m)   Wt 161 lb 4 oz (73.1 kg)   SpO2 98%   BMI 27.68 kg/m  General:  well developed, well nourished, in no apparent distress Skin:  no significant moles,  warts, or growths Head:  no masses, lesions, or tenderness Eyes:  pupils equal and round, sclera anicteric without injection Ears:  canals without lesions, TMs shiny without retraction, no obvious effusion, no erythema Nose:  nares patent, septum midline, mucosa normal, and no drainage or sinus tenderness Throat/Pharynx:  lips and gingiva without lesion; tongue and uvula midline; non-inflamed pharynx; no exudates or postnasal drainage Neck: neck supple without adenopathy, thyromegaly, or masses Lungs:  clear to auscultation, breath sounds equal bilaterally, no respiratory distress Cardio:  regular rate and rhythm, no LE edema Abdomen:  abdomen soft, nontender; bowel sounds normal; no masses or organomegaly Genital: Defer to GYN Musculoskeletal:  symmetrical muscle groups noted without atrophy or deformity Extremities:  no clubbing, cyanosis, or edema, no deformities, no skin discoloration Neuro:  gait normal; deep tendon reflexes normal and symmetric Psych: well oriented with normal range of affect and appropriate judgment/insight  Assessment and Plan  Well adult exam - Plan: CBC, Comprehensive metabolic panel, Lipid panel  Hypothyroidism, unspecified type - Plan: TSH, T4, free   Well 44 y.o. female. Counseled on diet and exercise. Shared decision making performed regarding mammograms for breast cancer screening and limitations in younger females. She will hold off for now.  Other orders as above. Follow up in 6 mo. The patient voiced understanding and agreement to the plan.  [address] [place], DO 12/05/19 8:22 AM

## 2019-12-05 NOTE — Patient Instructions (Signed)
Give [place] 2-3 business days to get the results of your labs back.   Keep the diet clean and stay active.  I recommend the Tdap (tetanus shot) every 10 years. I would recommend getting yours no sooner than 2 weeks after your second covid shot.  Let us know if you need anything.

## 2020-06-04 ENCOUNTER — Other Ambulatory Visit: Payer: Self-pay

## 2020-06-04 ENCOUNTER — Ambulatory Visit: Payer: BC Managed Care – PPO | Admitting: Family Medicine

## 2020-06-04 ENCOUNTER — Encounter: Payer: Self-pay | Admitting: Family Medicine

## 2020-06-04 VITALS — BP 108/68 | HR 103 | Temp 98.5°F | Ht 64.0 in | Wt 161.4 lb

## 2020-06-04 DIAGNOSIS — E039 Hypothyroidism, unspecified: Secondary | ICD-10-CM | POA: Diagnosis not present

## 2020-06-04 DIAGNOSIS — B001 Herpesviral vesicular dermatitis: Secondary | ICD-10-CM

## 2020-06-04 DIAGNOSIS — J302 Other seasonal allergic rhinitis: Secondary | ICD-10-CM

## 2020-06-04 DIAGNOSIS — F419 Anxiety disorder, unspecified: Secondary | ICD-10-CM | POA: Diagnosis not present

## 2020-06-04 DIAGNOSIS — J3089 Other allergic rhinitis: Secondary | ICD-10-CM | POA: Insufficient documentation

## 2020-06-04 LAB — T4, FREE: Free T4: 1.3 ng/dL (ref 0.8–1.8)

## 2020-06-04 LAB — TSH: TSH: 4.37 mIU/L

## 2020-06-04 MED ORDER — LEVOCETIRIZINE DIHYDROCHLORIDE 5 MG PO TABS: 5.0000 mg | ORAL_TABLET | Freq: Every evening | ORAL | 2 refills | Status: AC

## 2020-06-04 MED ORDER — FLUTICASONE PROPIONATE 50 MCG/ACT NA SUSP: 2.0000 | Freq: Every day | NASAL | 2 refills | Status: DC

## 2020-06-04 NOTE — Progress Notes (Signed)
Chief Complaint  Patient presents with  . Follow-up    Subjective Marcia Rodgers presents for f/u anxiety/depression.  Pt is currently being treated with Wellbutrin 200 mg bid.  Having reflux s/s's after 2nd dosage over past several weeks.  Reports doing well since treatment. No thoughts of harming self or others. No self-medication with alcohol, prescription drugs or illicit drugs. Pt is not following with a counselor/psychologist.  Hypothyroidism Patient presents for follow-up of hypothyroidism.  Reports compliance with medication- levothyroxine 100 mcg/d. Current symptoms include: denies fatigue, weight changes, heat/cold intolerance, bowel/skin changes or CVS symptoms She believes her dose should be unchanged  +hx of seasonal allergies She has tried Claritin, Zyrtec, Allegra in past without relief. Unsure what she is allergic to. She has itchy/watery eyes, running nose, congestion, scratch throat, sinus pressure. No fevers or sob. Interested in seeing an allergist. Is not currently taking anything.   +hx of cold sores. She takes Valtrex prn. Unsure why she is having flare. Has had one in throat for the past 2 mo. No known inj. Unsure if it got better or came back. She has never been on suppressive Valtrex.   Past Medical History:  Diagnosis Date  . Allergy   . Anxiety   . Arthritis   . Gluten intolerance   . Hypothyroidism   . Rheumatoid arthritis (HCC)    Allergies as of 06/04/2020      Reactions   Gluten Meal Other (See Comments)      Medication List       Accurate as of June 04, 2020  8:27 AM. If you have any questions, ask your nurse or doctor.        buPROPion 200 MG 12 hr tablet Commonly known as: WELLBUTRIN SR Take 1 tablet (200 mg total) by mouth 2 (two) times daily.   fluticasone 50 MCG/ACT nasal spray Commonly known as: FLONASE Place 2 sprays into both nostrils daily. Started by: Sharlene Dory, DO   levocetirizine 5 MG tablet Commonly  known as: XYZAL Take 1 tablet (5 mg total) by mouth every evening. Started by: Sharlene Dory, DO   Loryna 3-0.02 MG tablet Generic drug: drospirenone-ethinyl estradiol Take 1 tablet by mouth daily.   NONFORMULARY OR COMPOUNDED ITEM Levothyroxine SR 100 mcg       Exam BP 108/68 (BP Location: Left Arm, Patient Position: Sitting, Cuff Size: Normal)   Pulse (!) 103   Temp 98.5 F (36.9 C) (Oral)   Ht 5\' 4"  (1.626 m)   Wt 161 lb 6 oz (73.2 kg)   SpO2 99%   BMI 27.70 kg/m  General:  well developed, well nourished, in no apparent distress Lungs:  CTAB. No respiratory distress HEENT: Ears neg b/l, nares patent w/o dc, MMM, tonsillar pillars with erythema and a small ulcer on L side; no exudate Heart: RRR Abd: BS+, S, NT, ND Psych: well oriented with normal range of affect and age-appropriate judgement/insight, alert and oriented x4.  Assessment and Plan  Hypothyroidism, unspecified type - Plan: T4, free, TSH  Seasonal allergies - Plan: Ambulatory referral to Allergy  Recurrent cold sores  Anxiety  1. Cont Synthroid 100 mcg/d. Ck labs. 2. Start Xyzal and Flonase. Refer allergist. 3. She wishes to see what labs are and see allergist prior to considering daily Valtrex. She will send a message. 4. Cont Wellbutrin. She has changed diet and will see if certain changes can affect her digestive s/s's after evening dosage of Wellbutrin.  F/u in 6 mo  for CPE or prn. The patient voiced understanding and agreement to the plan.  Jilda Roche [place], DO 06/04/20 8:27 AM

## 2020-06-04 NOTE — Patient Instructions (Signed)
If you do not hear anything about your referral in the next 1-2 weeks, call our office and ask for an update.  Give [place] 2-3 business days to get the results of your labs back.   Send me a message after your allergy appointment and we can consider daily valtrex for your sores.  Let us know if you need anything.

## 2020-06-30 ENCOUNTER — Other Ambulatory Visit: Payer: Self-pay

## 2020-06-30 ENCOUNTER — Ambulatory Visit: Payer: BC Managed Care – PPO | Admitting: Allergy and Immunology

## 2020-06-30 ENCOUNTER — Encounter: Payer: Self-pay | Admitting: Allergy and Immunology

## 2020-06-30 ENCOUNTER — Telehealth: Payer: Self-pay

## 2020-06-30 VITALS — BP 102/80 | HR 89 | Temp 98.7°F | Resp 16 | Ht 64.69 in | Wt 163.0 lb

## 2020-06-30 DIAGNOSIS — H1010 Acute atopic conjunctivitis, unspecified eye: Secondary | ICD-10-CM | POA: Insufficient documentation

## 2020-06-30 DIAGNOSIS — H1013 Acute atopic conjunctivitis, bilateral: Secondary | ICD-10-CM

## 2020-06-30 DIAGNOSIS — J3089 Other allergic rhinitis: Secondary | ICD-10-CM

## 2020-06-30 MED ORDER — OLOPATADINE HCL 0.2 % OP SOLN: 1.0000 [drp] | Freq: Every day | OPHTHALMIC | 5 refills | Status: DC | PRN

## 2020-06-30 MED ORDER — AZELASTINE HCL 0.1 % NA SOLN: NASAL | 5 refills | Status: DC

## 2020-06-30 MED ORDER — CARBINOXAMINE MALEATE 6 MG PO TABS: ORAL_TABLET | ORAL | 5 refills | Status: DC

## 2020-06-30 NOTE — Progress Notes (Signed)
New Patient Note  RE: Marcia Rodgers MRN: [id number] DOB: December 07, 1975 Date of Office Visit: 06/30/2020  Referring provider: Sharlene Dory* Primary care provider: Sharlene Dory, DO  Chief Complaint: Allergic Rhinitis    History of present illness: Marcia Rodgers is a 44 y.o. female seen today in consultation requested by Sharlene Dory, DO.  She complains of nasal congestion, rhinorrhea, sneezing, copious postnasal drainage, throat clearing "all the time", and ocular pruritus.  She states that she has tried "everything" over-the-counter.  She was started on fluticasone nasal spray and levocetirizine with moderate benefit, however they do not "completely wiped out."  Her symptoms occur year-round but tend to be more frequent and severe during the springtime and in the fall.  Suspected triggers include pollen and dust.  Assessment and plan: Seasonal and perennial allergic rhinitis  Aeroallergen avoidance measures have been discussed and provided in written form.  A prescription has been provided for RyVent (carbinoxamine maleate) 6mg  every 6-8 hours as needed.  A prescription has been provided for azelastine nasal spray, 1-2 sprays per nostril 2 times daily as needed. Proper nasal spray technique has been discussed and demonstrated.   Nasal saline lavage (NeilMed) has been recommended as needed and prior to medicated nasal sprays along with instructions for proper administration.  For thick post nasal drainage, add guaifenesin 1200 mg (Mucinex Maximum Strength)  twice daily as needed with adequate hydration as discussed.  If allergen avoidance measures and medications fail to adequately relieve symptoms, aeroallergen immunotherapy will be considered.  Allergic conjunctivitis  Treatment plan as outlined above for allergic rhinitis.  A prescription has been provided for generic Pataday, one drop per eye daily as needed.  If insurance does not cover this  medication, medicated allergy eyedrops may be purchased over-the-counter as .  I have also recommended eye lubricant drops (i.e., Natural Tears) as needed.   Meds ordered this encounter  Medications  . Carbinoxamine Maleate (RYVENT) 6 MG TABS    Sig: One table (6 mg) every 6-8 hours as needed    Dispense:  120 tablet    Refill:  5  . Olopatadine HCl 0.2 % SOLN    Sig: Apply 1 drop to eye daily as needed.    Dispense:  2.5 mL    Refill:  5  . azelastine (ASTELIN) 0.1 % nasal spray    Sig: 1-2 sprays per nostril 2 times a day as needed    Dispense:  30 mL    Refill:  5    Diagnostics: Environmental skin testing: Reactive to ragweed pollen, weed pollen, grass pollen, and dust mite antigen.  Physical examination: Blood pressure 102/80, pulse 89, temperature 98.7 F (37.1 C), temperature source Tympanic, resp. rate 16, height 5' 4.69" (1.643 m), weight 163 lb (73.9 kg), SpO2 97 %.  General: Alert, interactive, in no acute distress. HEENT: TMs pearly gray, turbinates moderately edematous with clear discharge, post-pharynx moderately erythematous. Neck: Supple without lymphadenopathy. Lungs: Clear to auscultation without wheezing, rhonchi or rales. CV: Normal S1, S2 without murmurs. Abdomen: Nondistended, nontender. Skin: Warm and dry, without lesions or rashes. Extremities:  No clubbing, cyanosis or edema. Neuro:   Grossly intact.  Review of systems:  Review of systems negative except as noted in HPI / PMHx or noted below: Review of Systems  Constitutional: Negative.   HENT: Negative.   Eyes: Negative.   Respiratory: Negative.   Cardiovascular: Negative.   Gastrointestinal: Negative.   Genitourinary: Negative.  Musculoskeletal: Negative.   Skin: Negative.   Neurological: Negative.   Endo/Heme/Allergies: Negative.   Psychiatric/Behavioral: Negative.     Past medical history:  Past Medical History:  Diagnosis Date  . Allergy   .  Anxiety   . Arthritis   . Gluten intolerance   . Hypothyroidism   . Rheumatoid arthritis (HCC)     Past surgical history:  Past Surgical History:  Procedure Laterality Date  . COSMETIC SURGERY     breast, arms, around stomach    Family history: Family History  Problem Relation Age of Onset  . Allergic rhinitis Mother   . Allergic rhinitis Sister   . Cancer Maternal Grandmother   . Heart attack Maternal Grandfather   . Hyperlipidemia Maternal Grandfather   . Stroke Maternal Grandfather     Social history: Social History   Socioeconomic History  . Marital status: Married    Spouse name: Not on file  . Number of children: Not on file  . Years of education: Not on file  . Highest education level: Bachelor's degree (e.g., BA, AB, BS)  Occupational History  . Occupation: Charity fundraiser  Tobacco Use  . Smoking status: Former Games developer  . Smokeless tobacco: Never Used  Vaping Use  . Vaping Use: Never used  Substance and Sexual Activity  . Alcohol use: Yes  . Drug use: No  . Sexual activity: Not on file  Other Topics Concern  . Not on file  Social History Narrative   Exercise-no   Social Determinants of Health   Financial Resource Strain:   . Difficulty of Paying Living Expenses: Not on file  Food Insecurity:   . Worried About Programme researcher, broadcasting/film/video in the Last Year: Not on file  . Ran Out of Food in the Last Year: Not on file  Transportation Needs:   . Lack of Transportation (Medical): Not on file  . Lack of Transportation (Non-Medical): Not on file  Physical Activity:   . Days of Exercise per Week: Not on file  . Minutes of Exercise per Session: Not on file  Stress:   . Feeling of Stress : Not on file  Social Connections:   . Frequency of Communication with Friends and Family: Not on file  . Frequency of Social Gatherings with Friends and Family: Not on file  . Attends Religious Services: Not on file  . Active Member of Clubs or Organizations: Not on file  . Attends  Banker Meetings: Not on file  . Marital Status: Not on file  Intimate Partner Violence:   . Fear of Current or Ex-Partner: Not on file  . Emotionally Abused: Not on file  . Physically Abused: Not on file  . Sexually Abused: Not on file    Environmental History: The patient lives in a 43 year old house with carpeting in the bedroom and central air/heat.  There is no known mold/water damage in the home.  There are no pets in the home.  She is a former cigarette smoker having quit in 2008.  Current Outpatient Medications  Medication Sig Dispense Refill  . buPROPion (WELLBUTRIN SR) 200 MG 12 hr tablet Take 1 tablet (200 mg total) by mouth 2 (two) times daily. 180 tablet 2  . fluticasone (FLONASE) 50 MCG/ACT nasal spray Place 2 sprays into both nostrils daily. 16 g 2  . levocetirizine (XYZAL) 5 MG tablet Take 1 tablet (5 mg total) by mouth every evening. 30 tablet 2  . Levomefolate Glucosamine (METHYLFOLATE PO) Take 1,000 mg  by mouth daily.    Marland Kitchen Lysine 1000 MG TABS Take 1,000 mg by mouth daily.    . NONFORMULARY OR COMPOUNDED ITEM Levothyroxine SR 100 mcg 90 each 3  . valACYclovir (VALTREX) 1000 MG tablet Take by mouth as needed.    Marland Kitchen azelastine (ASTELIN) 0.1 % nasal spray 1-2 sprays per nostril 2 times a day as needed 30 mL 5  . Carbinoxamine Maleate (RYVENT) 6 MG TABS One table (6 mg) every 6-8 hours as needed 120 tablet 5  . Olopatadine HCl 0.2 % SOLN Apply 1 drop to eye daily as needed. 2.5 mL 5   No current facility-administered medications for this visit.    Known medication allergies: Allergies  Allergen Reactions  . Dairycare [Lactase-Lactobacillus] Other (See Comments)    GI upset  . Citric Acid     AVOIDS ACIDIC FOODS  . Gluten Meal Other (See Comments)    rheumatoid arthritis, GI upset     I appreciate the opportunity to take part in Briony's care. Please do not hesitate to contact me with questions.  Sincerely,   R. Jorene Guest, MD

## 2020-06-30 NOTE — Assessment & Plan Note (Signed)
   Treatment plan as outlined above for allergic rhinitis.  A prescription has been provided for generic Pataday, one drop per eye daily as needed.  If insurance does not cover this medication, medicated allergy eyedrops may be purchased over-the-counter as Pataday Extra Strength or Zaditor.  I have also recommended eye lubricant drops (i.e., Natural Tears) as needed. 

## 2020-06-30 NOTE — Telephone Encounter (Signed)
Patient called stating Ryvent is not covered under her insurance. Is there another medication that can be taken in the place of Ryvent?  Phone# [phone]

## 2020-06-30 NOTE — Patient Instructions (Addendum)
Seasonal and perennial allergic rhinitis  Aeroallergen avoidance measures have been discussed and provided in written form.  A prescription has been provided for RyVent (carbinoxamine maleate) 6mg  every 6-8 hours as needed.  A prescription has been provided for azelastine nasal spray, 1-2 sprays per nostril 2 times daily as needed. Proper nasal spray technique has been discussed and demonstrated.   Nasal saline lavage (NeilMed) has been recommended as needed and prior to medicated nasal sprays along with instructions for proper administration.  For thick post nasal drainage, add guaifenesin 1200 mg (Mucinex Maximum Strength)  twice daily as needed with adequate hydration as discussed.  If allergen avoidance measures and medications fail to adequately relieve symptoms, aeroallergen immunotherapy will be considered.  Allergic conjunctivitis  Treatment plan as outlined above for allergic rhinitis.  A prescription has been provided for generic Pataday, one drop per eye daily as needed.  If insurance does not cover this medication, medicated allergy eyedrops may be purchased over-the-counter as .  I have also recommended eye lubricant drops (i.e., Natural Tears) as needed.   Return in about 4 months (around 10/28/2020), or if symptoms worsen or fail to improve.  Control of House Dust Mite Allergen  House dust mites play a major role in allergic asthma and rhinitis.  They occur in environments with high humidity wherever human skin, the food for dust mites is found. High levels have been detected in dust obtained from mattresses, pillows, carpets, upholstered furniture, bed covers, clothes and soft toys.  The principal allergen of the house dust mite is found in its feces.  A gram of dust may contain 1,000 mites and 250,000 fecal particles.  Mite antigen is easily measured in the air during house cleaning activities.    1. Encase mattresses, including the box  spring, and pillow, in an air tight cover.  Seal the zipper end of the encased mattresses with wide adhesive tape. 2. Wash the bedding in water of 130 degrees Farenheit weekly.  Avoid cotton comforters/quilts and flannel bedding: the most ideal bed covering is the dacron comforter. 3. Remove all upholstered furniture from the bedroom. 4. Remove carpets, carpet padding, rugs, and non-washable window drapes from the bedroom.  Wash drapes weekly or use plastic window coverings. 5. Remove all non-washable stuffed toys from the bedroom.  Wash stuffed toys weekly. 6. Have the room cleaned frequently with a vacuum cleaner and a damp dust-mop.  The patient should not be in a room which is being cleaned and should wait 1 hour after cleaning before going into the room. 7. Close and seal all heating outlets in the bedroom.  Otherwise, the room will become filled with dust-laden air.  An electric heater can be used to heat the room. 8. Reduce indoor humidity to less than 50%.  Do not use a humidifier.  Reducing Pollen Exposure    1. Use nasal saline spray (i.e., Simply Saline) as needed. 2. Use eye lubricant drops (i.e., Natural Tears) as needed. 3. Do not hang sheets or clothing out to dry; pollen may collect on these items. 4. Do not mow lawns or spend time around freshly cut grass; mowing stirs up pollen. 5. Keep windows closed at night.  Keep car windows closed while driving. 6. Minimize morning activities outdoors, a time when pollen counts are usually at their highest. 7. Stay indoors as much as possible when pollen counts or humidity is high and on windy days when pollen tends to remain in the air  longer. 8. Use air conditioning when possible.  Many air conditioners have filters that trap the pollen spores. 9. Use a HEPA room air filter to remove pollen form the indoor air you breathe.

## 2020-06-30 NOTE — Assessment & Plan Note (Signed)
   Aeroallergen avoidance measures have been discussed and provided in written form.  A prescription has been provided for RyVent (carbinoxamine maleate) 6mg  every 6-8 hours as needed.  A prescription has been provided for azelastine nasal spray, 1-2 sprays per nostril 2 times daily as needed. Proper nasal spray technique has been discussed and demonstrated.   Nasal saline lavage (NeilMed) has been recommended as needed and prior to medicated nasal sprays along with instructions for proper administration.  For thick post nasal drainage, add guaifenesin 1200 mg (Mucinex Maximum Strength)  twice daily as needed with adequate hydration as discussed.  If allergen avoidance measures and medications fail to adequately relieve symptoms, aeroallergen immunotherapy will be considered.

## 2020-06-30 NOTE — Telephone Encounter (Signed)
Carbinoxamine 4 mg every 6-8 hours if needed.

## 2020-07-01 ENCOUNTER — Other Ambulatory Visit: Payer: Self-pay

## 2020-07-01 MED ORDER — CARBINOXAMINE MALEATE 6 MG PO TABS
ORAL_TABLET | ORAL | 5 refills | Status: DC
Start: 2020-07-01 — End: 2021-12-18

## 2020-07-01 NOTE — Telephone Encounter (Signed)
Patient sent a MyChart message through patient portal concerned about the cost of Ryvent that was sent in by our office. Per chart the medication was sent to her pharmacy instead of [facility]. Resent medication to correct pharmacy. Patient informed and given number to call to give her  insurance information. Advised patient that her copay should be a 0 or $15.00 copay. Verbalized understanding.

## 2020-07-01 NOTE — Telephone Encounter (Signed)
Please see "Mychart" for more information regarding this matter.

## 2020-10-29 ENCOUNTER — Ambulatory Visit: Payer: BC Managed Care – PPO | Admitting: Allergy and Immunology

## 2020-12-05 ENCOUNTER — Ambulatory Visit (INDEPENDENT_AMBULATORY_CARE_PROVIDER_SITE_OTHER): Payer: BC Managed Care – PPO | Admitting: Family Medicine

## 2020-12-05 ENCOUNTER — Ambulatory Visit (HOSPITAL_BASED_OUTPATIENT_CLINIC_OR_DEPARTMENT_OTHER)
Admission: RE | Admit: 2020-12-05 | Discharge: 2020-12-05 | Disposition: A | Discharge status: Home / Self Care | Payer: BC Managed Care – PPO | Source: Ambulatory Visit | Attending: Family Medicine | Admitting: Family Medicine

## 2020-12-05 ENCOUNTER — Other Ambulatory Visit: Payer: Self-pay

## 2020-12-05 ENCOUNTER — Encounter: Payer: Self-pay | Admitting: Family Medicine

## 2020-12-05 VITALS — BP 122/80 | HR 106 | Temp 99.0°F | Ht 64.0 in | Wt 175.0 lb

## 2020-12-05 DIAGNOSIS — E039 Hypothyroidism, unspecified: Secondary | ICD-10-CM | POA: Diagnosis not present

## 2020-12-05 DIAGNOSIS — Z1159 Encounter for screening for other viral diseases: Secondary | ICD-10-CM | POA: Diagnosis not present

## 2020-12-05 DIAGNOSIS — M0609 Rheumatoid arthritis without rheumatoid factor, multiple sites: Secondary | ICD-10-CM | POA: Diagnosis not present

## 2020-12-05 DIAGNOSIS — F419 Anxiety disorder, unspecified: Secondary | ICD-10-CM | POA: Diagnosis not present

## 2020-12-05 DIAGNOSIS — R2241 Localized swelling, mass and lump, right lower limb: Secondary | ICD-10-CM

## 2020-12-05 DIAGNOSIS — M79661 Pain in right lower leg: Secondary | ICD-10-CM | POA: Diagnosis present

## 2020-12-05 DIAGNOSIS — Z0000 Encounter for general adult medical examination without abnormal findings: Secondary | ICD-10-CM

## 2020-12-05 DIAGNOSIS — M25461 Effusion, right knee: Secondary | ICD-10-CM

## 2020-12-05 LAB — LIPID PANEL
Cholesterol: 195 mg/dL (ref 0–200)
HDL: 70.2 mg/dL (ref >39.00)
LDL Cholesterol: 89 mg/dL (ref 0–99)
NonHDL: 125.14
Total CHOL/HDL Ratio: 3
Triglycerides: 181 mg/dL — ABNORMAL HIGH (ref 0.0–149.0)
VLDL: 36.2 mg/dL (ref 0.0–40.0)

## 2020-12-05 LAB — COMPREHENSIVE METABOLIC PANEL
ALT: 16 U/L (ref 0–35)
AST: 14 U/L (ref 0–37)
Albumin: 4.1 g/dL (ref 3.5–5.2)
Alkaline Phosphatase: 62 U/L (ref 39–117)
BUN: 16 mg/dL (ref 6–23)
CO2: 27 mEq/L (ref 19–32)
Calcium: 9.4 mg/dL (ref 8.4–10.5)
Chloride: 102 mEq/L (ref 96–112)
Creatinine, Ser: 0.9 mg/dL (ref 0.40–1.20)
GFR: 78.08 mL/min (ref >60.00)
Glucose, Bld: 84 mg/dL (ref 70–99)
Potassium: 4.6 mEq/L (ref 3.5–5.1)
Sodium: 136 mEq/L (ref 135–145)
Total Bilirubin: 0.3 mg/dL (ref 0.2–1.2)
Total Protein: 7.1 g/dL (ref 6.0–8.3)

## 2020-12-05 LAB — D-DIMER, QUANTITATIVE: D-Dimer, Quant: 0.67 mcg/mL FEU — ABNORMAL HIGH (ref <0.50)

## 2020-12-05 LAB — TSH: TSH: 2.74 u[IU]/mL (ref 0.35–4.50)

## 2020-12-05 LAB — T4, FREE: Free T4: 0.91 ng/dL (ref 0.60–1.60)

## 2020-12-05 LAB — C-REACTIVE PROTEIN: CRP: <1 mg/dL (ref 0.5–20.0)

## 2020-12-05 MED ORDER — BUPROPION HCL ER (XL) 150 MG PO TB24: 150.0000 mg | ORAL_TABLET | Freq: Every day | ORAL | 3 refills | Status: DC

## 2020-12-05 MED ORDER — NONFORMULARY OR COMPOUNDED ITEM: 3 refills | Status: DC

## 2020-12-05 NOTE — Patient Instructions (Addendum)
Give [place] 2-3 business days to get the results of your labs back.   Keep the diet clean and stay active.  Get your tetanus shot at your convenience.   For the swelling in your lower extremities, be sure to elevate your legs when able, mind the salt intake, stay physically active and consider wearing compression stockings.  Let us know if you need anything.  Stretching and range of motion exercises These exercises warm up your muscles and joints and improve the movement and flexibility of your lower leg. These exercises also help to relieve pain and stiffness.  Exercise A: Gastrocnemius stretch 1. Sit with your left / right leg extended. 2. Loop a belt or towel around the ball of your left / right foot. The ball of your foot is on the walking surface, right under your toes. 3. Hold both ends of the belt or towel. 4. Keep your left / right ankle and foot relaxed and keep your knee straight while you use the belt or towel to pull your foot and ankle toward you. Stop at the first point of resistance. 5. Hold this position for 30 seconds. Repeat 2 times. Complete this exercise 3 times per week.  Exercise B: Ankle alphabet 1. Sit with your left / right leg supported at the lower leg. ? Do not rest your foot on anything. ? Make sure your foot has room to move freely. 2. Think of your left / right foot as a paintbrush, and move your foot to trace each letter of the alphabet in the air. Keep your hip and knee still while you trace. 3. Trace every letter from A to Z. Repeat 2 times. Complete this exercise 3 times per week.  Strengthening exercises These exercises build strength and endurance in your lower leg. Endurance is the ability to use your muscles for a long time, even after they get tired.  Exercise C: Plantar flexors with band 1. Sit with your left / right leg extended. 2. Loop a rubber exercise band or tube around the ball of your left / right foot. The ball of your foot is on the  walking surface, right under your toes. 3. While holding both ends of the band or tube, slowly point your toes downward, pushing them away from you. 4. Hold this position for 3 seconds. 5. Slowly return your foot to the starting position and repeat for a total of 10 repetitions. Repeat 2 times. Complete this exercise 3 times per week.  Exercise D: Plantar flexors, standing 1. Stand with your feet shoulder-width apart. 2. Place your hands on a wall or table to steady yourself as needed, but try not to use it very much for support. 3. Rise up on your toes. 4. If this exercise is too easy, try these options: ? Shift your weight toward your left / right leg until you feel challenged. ? If told by your health care provider, stand on your left / right foot only. 5. Hold this position for 3 seconds. 6. Repeat for a total of 10 repetitions. Repeat 2 times. Complete this exercise 3 times per week.  Exercise E: Plantar flexors, eccentric 1. Stand on the balls of your feet on the edge of a step. The ball of your foot is on the walking surface, right under your toes. 2. Place your hands on a wall or railing for balance as needed, but try not to lean on it for support. 3. Rise up on your toes, using both legs  to help. 4. Slowly shift all of your weight to your left / right foot and lift your other foot off the step. 5. Slowly lower your left / right heel so it drops below the level of the step. You will feel a slight stretch in your left / right calf. 6. Put your other foot back onto the step. Repeat 2 times. Complete this exercise 3 times per week. This information is not intended to replace advice given to you by your health care provider. Make sure you discuss any questions you have with your health care provider.   Knee Exercises It is normal to feel mild stretching, pulling, tightness, or discomfort as you do these exercises, but you should stop right away if you feel sudden pain or your pain  gets worse. STRETCHING AND RANGE OF MOTION EXERCISES  These exercises warm up your muscles and joints and improve the movement and flexibility of your knee. These exercises also help to relieve pain, numbness, and tingling. Exercise A: Knee Extension, Prone  1. Lie on your abdomen on a bed. 2. Place your left / right knee just beyond the edge of the surface so your knee is not on the bed. You can put a towel under your left / right thigh just above your knee for comfort. 3. Relax your leg muscles and allow gravity to straighten your knee. You should feel a stretch behind your left / right knee. 4. Hold this position for 30 seconds. 5. Scoot up so your knee is supported between repetitions. Repeat 2 times. Complete this stretch 3 times per week. Exercise B: Knee Flexion, Active    1. Lie on your back with both knees straight. If this causes back discomfort, bend your left / right knee so your foot is flat on the floor. 2. Slowly slide your left / right heel back toward your buttocks until you feel a gentle stretch in the front of your knee or thigh. 3. Hold this position for 30 seconds. 4. Slowly slide your left / right heel back to the starting position. Repeat 2 times. Complete this exercise 3 times per week. Exercise C: Quadriceps, Prone    1. Lie on your abdomen on a firm surface, such as a bed or padded floor. 2. Bend your left / right knee and hold your ankle. If you cannot reach your ankle or pant leg, loop a belt around your foot and grab the belt instead. 3. Gently pull your heel toward your buttocks. Your knee should not slide out to the side. You should feel a stretch in the front of your thigh and knee. 4. Hold this position for 30 seconds. Repeat 2 times. Complete this stretch 3 times per week. Exercise D: Hamstring, Supine  1. Lie on your back. 2. Loop a belt or towel over the ball of your left / right foot. The ball of your foot is on the walking surface, right under your  toes. 3. Straighten your left / right knee and slowly pull on the belt to raise your leg until you feel a gentle stretch behind your knee. ? Do not let your left / right knee bend while you do this. ? Keep your other leg flat on the floor. 4. Hold this position for 30 seconds. Repeat 2 times. Complete this stretch 3 times per week. STRENGTHENING EXERCISES  These exercises build strength and endurance in your knee. Endurance is the ability to use your muscles for a long time, even after they  get tired. Exercise E: Quadriceps, Isometric    1. Lie on your back with your left / right leg extended and your other knee bent. Put a rolled towel or small pillow under your knee if told by your health care provider. 2. Slowly tense the muscles in the front of your left / right thigh. You should see your kneecap slide up toward your hip or see increased dimpling just above the knee. This motion will push the back of the knee toward the floor. 3. For 3 seconds, keep the muscle as tight as you can without increasing your pain. 4. Relax the muscles slowly and completely. Repeat for 10 total reps Repeat 2 ti mes. Complete this exercise 3 times per week. Exercise F: Straight Leg Raises - Quadriceps  1. Lie on your back with your left / right leg extended and your other knee bent. 2. Tense the muscles in the front of your left / right thigh. You should see your kneecap slide up or see increased dimpling just above the knee. Your thigh may even shake a bit. 3. Keep these muscles tight as you raise your leg 4-6 inches (10-15 cm) off the floor. Do not let your knee bend. 4. Hold this position for 3 seconds. 5. Keep these muscles tense as you lower your leg. 6. Relax your muscles slowly and completely after each repetition. 10 total reps. Repeat 2 times. Complete this exercise 3 times per week.  Exercise G: Hamstring Curls    If told by your health care provider, do this exercise while wearing ankle weights.  Begin with 5 lb weights (optional). Then increase the weight by 1 lb (0.5 kg) increments. Do not wear ankle weights that are more than 20 lbs to start with. 1. Lie on your abdomen with your legs straight. 2. Bend your left / right knee as far as you can without feeling pain. Keep your hips flat against the floor. 3. Hold this position for 3 seconds. 4. Slowly lower your leg to the starting position. Repeat for 10 reps.  Repeat 2 times. Complete this exercise 3 times per week. Exercise H: Squats (Quadriceps)  1. Stand in front of a table, with your feet and knees pointing straight ahead. You may rest your hands on the table for balance but not for support. 2. Slowly bend your knees and lower your hips like you are going to sit in a chair. ? Keep your weight over your heels, not over your toes. ? Keep your lower legs upright so they are parallel with the table legs. ? Do not let your hips go lower than your knees. ? Do not bend lower than told by your health care provider. ? If your knee pain increases, do not bend as low. 3. Hold the squat position for 1 second. 4. Slowly push with your legs to return to standing. Do not use your hands to pull yourself to standing. Repeat 2 times. Complete this exercise 3 times per week. Exercise I: Wall Slides (Quadriceps)    1. Lean your back against a smooth wall or door while you walk your feet out 18-24 inches (46-61 cm) from it. 2. Place your feet hip-width apart. 3. Slowly slide down the wall or door until your knees Repeat 2 times. Complete this exercise every other day. 4. Exercise K: Straight Leg Raises - Hip Abductors  1. Lie on your side with your left / right leg in the top position. Lie so your head, shoulder, knee,  and hip line up. You may bend your bottom knee to help you keep your balance. 2. Roll your hips slightly forward so your hips are stacked directly over each other and your left / right knee is facing forward. 3. Leading with your  heel, lift your top leg 4-6 inches (10-15 cm). You should feel the muscles in your outer hip lifting. ? Do not let your foot drift forward. ? Do not let your knee roll toward the ceiling. 4. Hold this position for 3 seconds. 5. Slowly return your leg to the starting position. 6. Let your muscles relax completely after each repetition. 10 total reps. Repeat 2 times. Complete this exercise 3 times per week. Exercise J: Straight Leg Raises - Hip Extensors  1. Lie on your abdomen on a firm surface. You can put a pillow under your hips if that is more comfortable. 2. Tense the muscles in your buttocks and lift your left / right leg about 4-6 inches (10-15 cm). Keep your knee straight as you lift your leg. 3. Hold this position for 3 seconds. 4. Slowly lower your leg to the starting position. 5. Let your leg relax completely after each repetition. Repeat 2 times. Complete this exercise 3 times per week. Document Released: 06/30/2005 Document Revised: 05/10/2016 Document Reviewed: 06/22/2015 Elsevier Interactive Patient Education  2017 [employer].

## 2020-12-05 NOTE — Progress Notes (Addendum)
Chief Complaint  Patient presents with  . Annual Exam     Well Woman Marcia Rodgers is here for a complete physical.   Her last physical was >1 year ago.  Current diet: in general, diet is fair. Current exercise: elliptical machine. Weight is increasing and she denies fatigue out of ordinary. Seatbelt? Yes  Health Maintenance Pap/HPV- Yes Tetanus- No Hep C screening- No HIV screening- Yes   Right lower extremity swelling Over the past 2 weeks, the patient has had swelling from her knee down to her feet.  She thinks she tweaked her knee around that time.  She has some swelling in her knee as well.  No bruising, fevers, shortness of breath, or redness.  No personal or family history of clotting disorders or DVT.  She denies any recent travel, surgeries, prolonged bedrest, or injury to the calf.  Past Medical History:  Diagnosis Date  . Allergy   . Anxiety   . Arthritis   . Gluten intolerance   . Hypothyroidism   . Rheumatoid arthritis [facility])      Past Surgical History:  Procedure Laterality Date  . COSMETIC SURGERY     breast, arms, around stomach    Medications  Current Outpatient Medications on File Prior to Visit  Medication Sig Dispense Refill  . azelastine (ASTELIN) 0.1 % nasal spray 1-2 sprays per nostril 2 times a day as needed 30 mL 5  . buPROPion (WELLBUTRIN SR) 200 MG 12 hr tablet Take 1 tablet (200 mg total) by mouth 2 (two) times daily. 180 tablet 2  . Carbinoxamine Maleate (RYVENT) 6 MG TABS One table (6 mg) every 6-8 hours as needed 120 tablet 5  . fluticasone (FLONASE) 50 MCG/ACT nasal spray Place 2 sprays into both nostrils daily. 16 g 2  . levocetirizine (XYZAL) 5 MG tablet Take 1 tablet (5 mg total) by mouth every evening. 30 tablet 2  . Levomefolate Glucosamine (METHYLFOLATE PO) Take 1,000 mg by mouth daily.    Marland Kitchen Lysine 1000 MG TABS Take 1,000 mg by mouth daily.    . NONFORMULARY OR COMPOUNDED ITEM Levothyroxine SR 100 mcg 90 each 3  . Olopatadine HCl 0.2  % SOLN Apply 1 drop to eye daily as needed. 2.5 mL 5  . valACYclovir (VALTREX) 1000 MG tablet Take by mouth as needed.     Allergies Allergies  Allergen Reactions  . Dairycare [Lactase-Lactobacillus] Other (See Comments)    GI upset  . Citric Acid     AVOIDS ACIDIC FOODS  . Gluten Meal Other (See Comments)    rheumatoid arthritis, GI upset     Review of Systems: Constitutional:  no unexpected weight changes Eye:  no recent significant change in vision Ear/Nose/Mouth/Throat:  Ears:  no recent change in hearing Nose/Mouth/Throat:  no complaints of nasal congestion, no sore throat Cardiovascular: no chest pain, + right lower extremity swelling Respiratory:  no shortness of breath Gastrointestinal:  no abdominal pain, no change in bowel habits GU:  Female: negative for dysuria or pelvic pain Musculoskeletal/Extremities:  no pain of the joints Integumentary (Skin/Breast):  no abnormal skin lesions reported Neurologic:  no headaches Endocrine:  denies fatigue Hematologic/Lymphatic:  No areas of easy bleeding  Exam BP 122/80 (BP Location: Left Arm, Patient Position: Sitting, Cuff Size: Normal)   Pulse (!) 106   Temp 99 F (37.2 C) (Oral)   Ht 5\' 4"  (1.626 m)   Wt 175 lb (79.4 kg)   SpO2 96%   BMI 30.04 kg/m  General:  well developed, well nourished, in no apparent distress Skin:  no significant moles, warts, or growths Head:  no masses, lesions, or tenderness Eyes:  pupils equal and round, sclera anicteric without injection Ears:  canals without lesions, TMs shiny without retraction, no obvious effusion, no erythema Nose:  nares patent, septum midline, mucosa normal, and no drainage or sinus tenderness Throat/Pharynx:  lips and gingiva without lesion; tongue and uvula midline; non-inflamed pharynx; no exudates or postnasal drainage Neck: neck supple without adenopathy, thyromegaly, or masses Lungs:  clear to auscultation, breath sounds equal bilaterally, no respiratory  distress Cardio:  regular rate and rhythm, no LE edema on the left, 2+ pitting edema tapering at the knee on the right Abdomen:  abdomen soft, nontender; bowel sounds normal; no masses or organomegaly Genital: Defer to GYN Musculoskeletal: There is mild tenderness to palpation over the medial right calf, none on the left.  There is no deformity.  Mild to moderate effusion noted on the right knee.  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 Counseled on diet and exercise. Other orders as above. Declines Tdap today, will get in future.  Prognosis uncertain. Check D-dimer.  If positive will get ultrasound of right lower extremity.  If negative, very low likelihood of an actual clot.  I did give her some stretches and exercises for both the calf and the knee to rehab it. Addendum: D-dimer came back elevated, stat US order.  We will change her Wellbutrin to 1 mg twice daily back to the extended release version. Follow up in 6 mo or prn. The patient voiced understanding and  agreement to the plan.  Jilda Roche [place], DO 12/05/20 1:44 PM

## 2020-12-05 NOTE — Addendum Note (Signed)
Addended by: Radene Gunning on: 12/05/2020 01:44 PM   Modules accepted: Orders

## 2020-12-08 LAB — HEPATITIS C ANTIBODY
Hepatitis C Ab: NONREACTIVE
SIGNAL TO CUT-OFF: 0.01 (ref <1.00)

## 2021-06-08 ENCOUNTER — Ambulatory Visit: Payer: BC Managed Care – PPO | Admitting: Family Medicine

## 2021-06-10 ENCOUNTER — Ambulatory Visit: Payer: BC Managed Care – PPO | Admitting: Family Medicine

## 2021-06-18 ENCOUNTER — Other Ambulatory Visit: Payer: Self-pay

## 2021-06-18 ENCOUNTER — Ambulatory Visit: Payer: BC Managed Care – PPO | Admitting: Family Medicine

## 2021-06-18 ENCOUNTER — Encounter: Payer: Self-pay | Admitting: Family Medicine

## 2021-06-18 VITALS — BP 127/60 | HR 85 | Temp 98.2°F | Resp 18 | Ht 64.0 in | Wt 180.0 lb

## 2021-06-18 DIAGNOSIS — E039 Hypothyroidism, unspecified: Secondary | ICD-10-CM

## 2021-06-18 DIAGNOSIS — F339 Major depressive disorder, recurrent, unspecified: Secondary | ICD-10-CM | POA: Diagnosis not present

## 2021-06-18 LAB — TSH: TSH: 2.01 u[IU]/mL (ref 0.35–5.50)

## 2021-06-18 LAB — T4, FREE: Free T4: 0.74 ng/dL (ref 0.60–1.60)

## 2021-06-18 MED ORDER — BUPROPION HCL ER (XL) 300 MG PO TB24: 300.0000 mg | ORAL_TABLET | Freq: Every day | ORAL | 2 refills | Status: DC

## 2021-06-18 NOTE — Patient Instructions (Signed)
Give [place] 2-3 business days to get the results of your labs back.   Keep the diet clean and stay active.  I recommend getting the updated bivalent covid vaccination booster at your convenience.   Let me know if/when you are ready for the tetanus shot.  Let us know if you need anything.

## 2021-06-18 NOTE — Progress Notes (Signed)
Chief Complaint  Patient presents with   Follow-up    Subjective Marcia Rodgers presents for f/u anxiety/depression.  Pt is currently being treated with Wellbutrin XL 150 mg/d.  Reports doing fine, feels she could be doing a bit better since treatment. No thoughts of harming self or others. No self-medication with alcohol, prescription drugs or illicit drugs. Pt is not following with a counselor/psychologist.  Hypothyroidism Patient presents for follow-up of hypothyroidism.  Reports compliance with medication- levothryoxine 100 mcg/d.  Current symptoms include: denies fatigue, weight changes, heat/cold intolerance, bowel/skin changes or CVS symptoms She believes her dose should be unchanged  Past Medical History:  Diagnosis Date   Allergy    Anxiety    Arthritis    Gluten intolerance    Hypothyroidism    Rheumatoid arthritis (HCC)    Allergies as of 06/18/2021       Reactions   Dairycare [lactase-lactobacillus] Other (See Comments)   GI upset   Citric Acid    AVOIDS ACIDIC FOODS   Gluten Meal Other (See Comments)   rheumatoid arthritis, GI upset         Medication List        Accurate as of June 18, 2021  7:50 AM. If you have any questions, ask your nurse or doctor.          azelastine 0.1 % nasal spray Commonly known as: ASTELIN 1-2 sprays per nostril 2 times a day as needed   buPROPion 300 MG 24 hr tablet Commonly known as: Wellbutrin XL Take 1 tablet (300 mg total) by mouth daily. What changed:  medication strength how much to take Changed by: Sharlene Dory, DO   Carbinoxamine Maleate 6 MG Tabs Commonly known as: RyVent One table (6 mg) every 6-8 hours as needed   fluticasone 50 MCG/ACT nasal spray Commonly known as: FLONASE Place 2 sprays into both nostrils daily.   levocetirizine 5 MG tablet Commonly known as: XYZAL Take 1 tablet (5 mg total) by mouth every evening.   Lysine 1000 MG Tabs Take 1,000 mg by mouth daily.    METHYLFOLATE PO Take 1,000 mg by mouth daily.   NONFORMULARY OR COMPOUNDED ITEM Levothyroxine SR 100 mcg   Olopatadine HCl 0.2 % Soln Apply 1 drop to eye daily as needed.   valACYclovir 1000 MG tablet Commonly known as: VALTREX Take by mouth as needed.        Exam BP 127/60   Pulse 85   Temp 98.2 F (36.8 C)   Resp 18   Ht 5\' 4"  (1.626 m)   Wt 180 lb (81.6 kg)   SpO2 100%   BMI 30.90 kg/m  General:  well developed, well nourished, in no apparent distress Heart: RRR, no LE edema Neck: Supple, symmetric, no asymmetry or masses.  Lungs:  CTAB. No respiratory distress Psych: well oriented with normal range of affect and age-appropriate judgement/insight, alert and oriented x4.  Assessment and Plan  Hypothyroidism, unspecified type  Depression, recurrent (HCC)  Chronic, unstable. Increase dosage of Wellbutrin XL from 150 mg/d to 300 mg/d. Discussed counseling, prefers to hold off on doing again for now.  Chronic, stable. Cont levothyroxine 100 mcg/d. Ck labs today.  F/u in 6 mo for CPE, sooner if #1 is not controlled, if she does not desire another medicine adjustment, she will try to change certain aspects of her life first before following up. The patient voiced understanding and agreement to the plan.  [place], DO 06/18/21 7:50 AM

## 2021-11-25 IMAGING — US US EXTREM LOW VENOUS*R*
1 series · 13 of 24 positions shown · non-contrast
Comparison: None.

CLINICAL DATA: 44-year-old female with right calf pain and swelling
for 3 weeks.

EXAM:
RIGHT LOWER EXTREMITY VENOUS DOPPLER ULTRASOUND
TECHNIQUE: Gray-scale sonography with graded compression, as well as color
Doppler and duplex ultrasound were performed to evaluate the right
lower extremity deep venous systems from the level of the common
femoral vein and including the common femoral, femoral, profunda
femoral, popliteal and calf veins including the posterior tibial,
peroneal and gastrocnemius veins when visible. Spectral Doppler was
utilized to evaluate flow at rest and with distal augmentation
maneuvers in the common femoral, femoral and popliteal veins. The
contralateral common femoral vein was also evaluated for comparison.

[Series 1: us extrem low venous*right* · 13 of 56 slices shown]
[im 1/56]
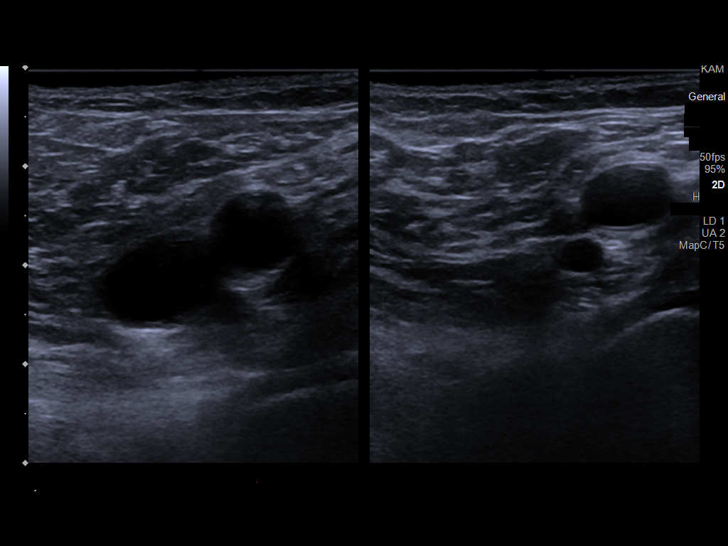
[im 5/56]
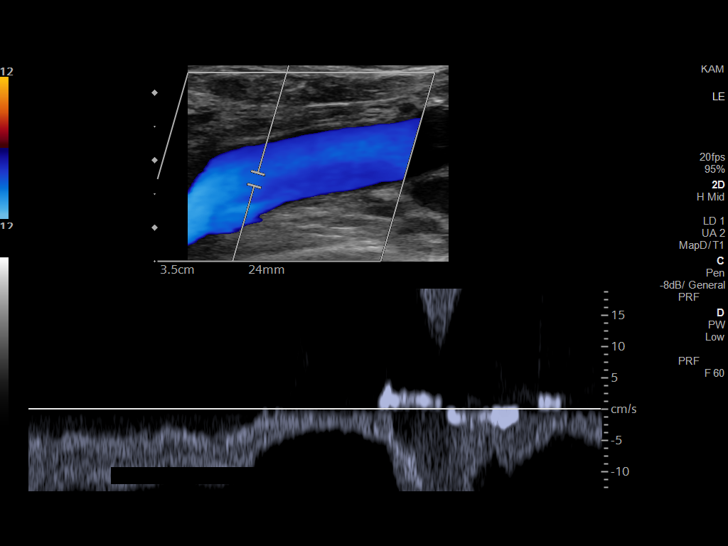
[im 10/56]
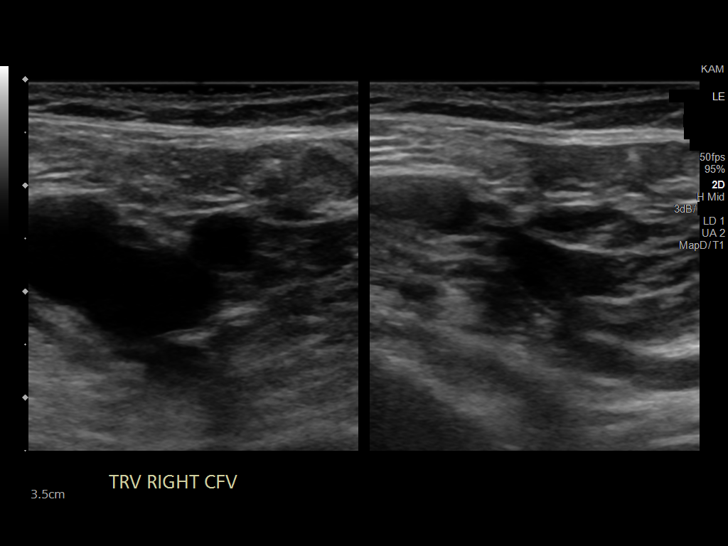
[im 15/56]
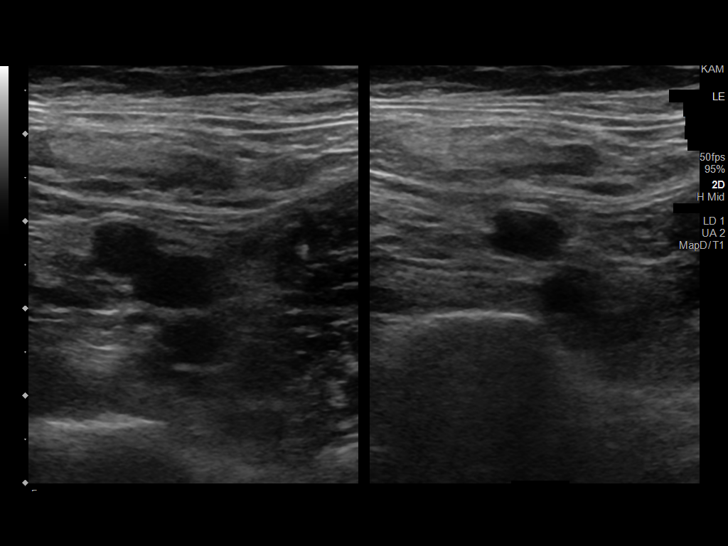
[im 20/56]
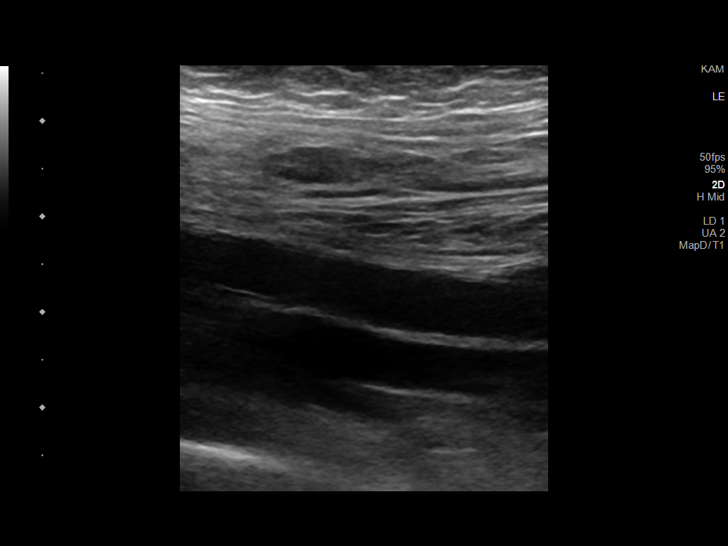
[im 24/56]
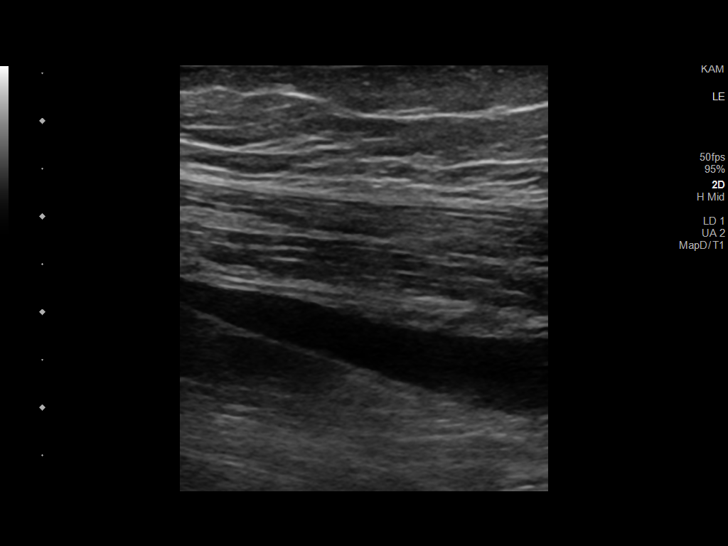
[im 29/56]
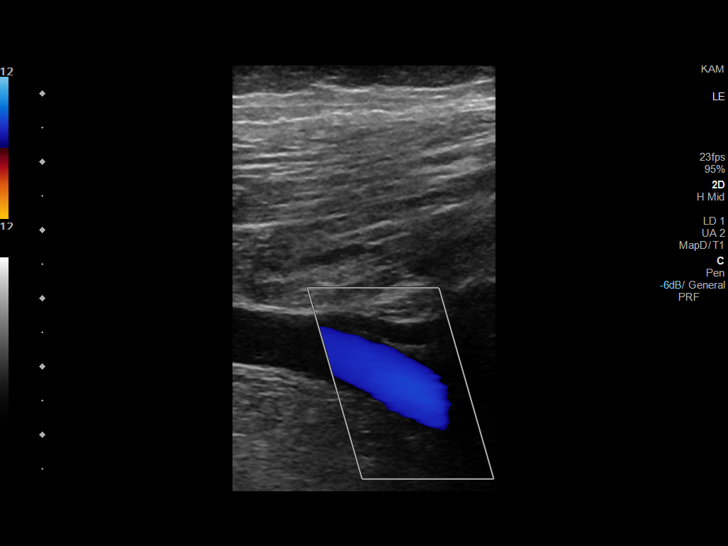
[im 32/56]
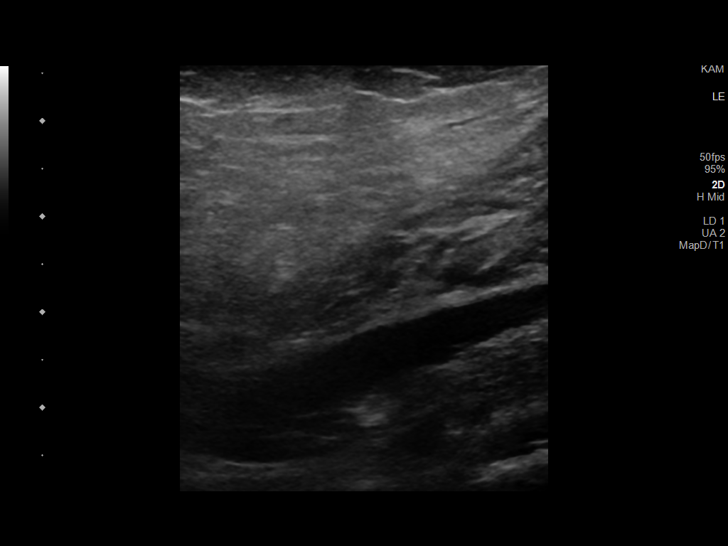
[im 36/56]
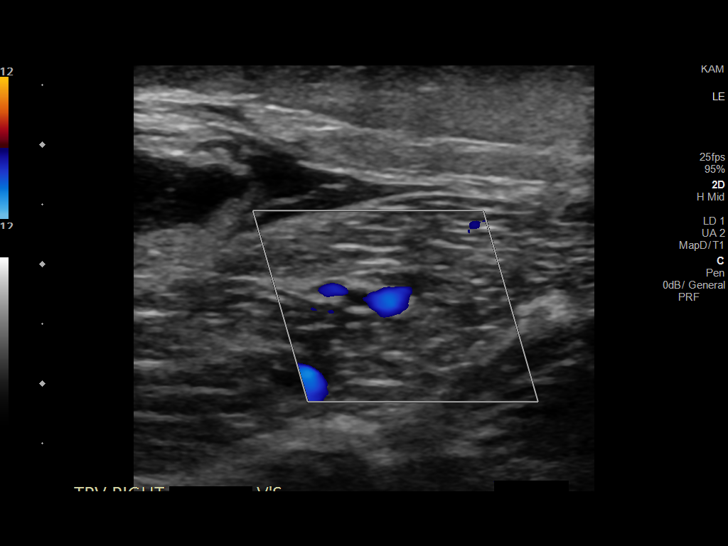
[im 41/56]
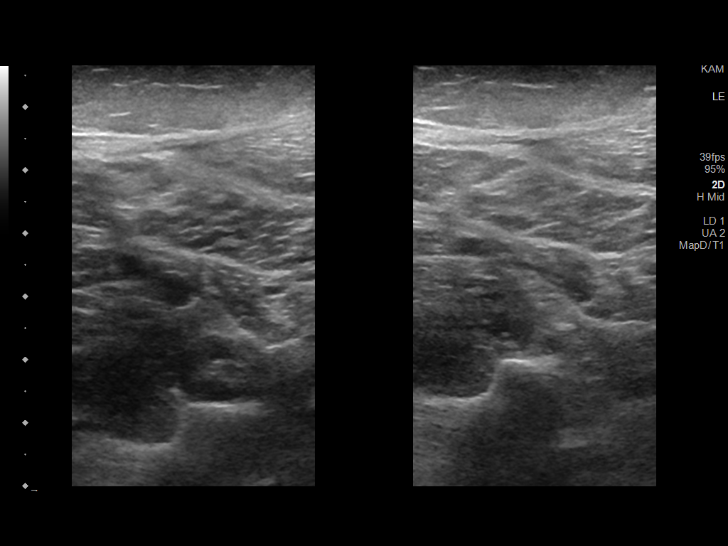
[im 46/56]
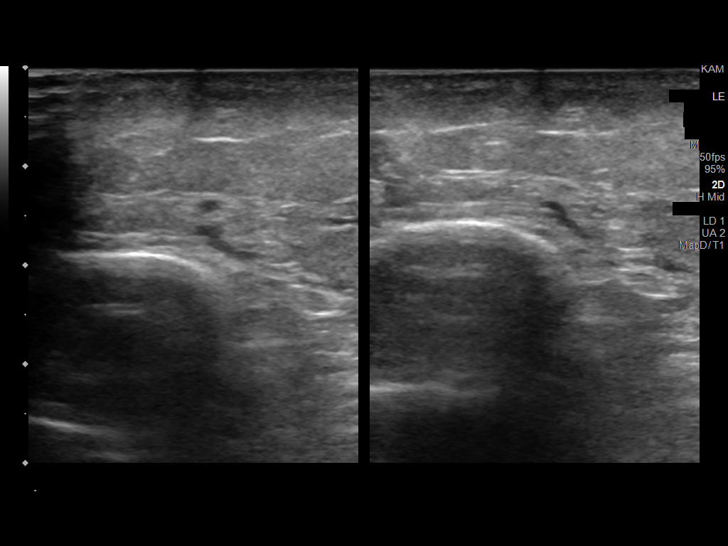
[im 51/56]
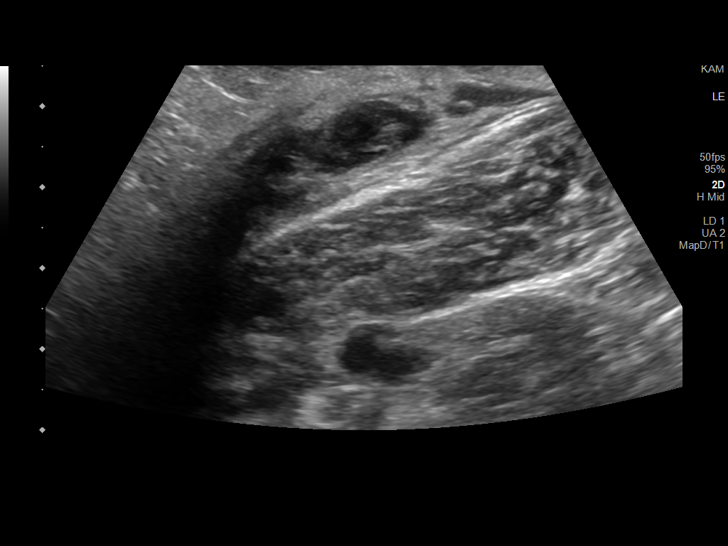
[im 56/56]
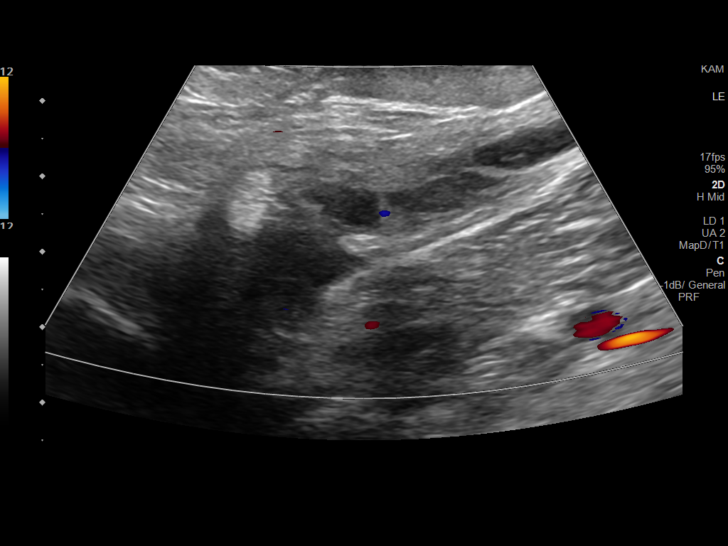

[13 of 24 positions shown; findings below may reference images not displayed]

FINDINGS: RIGHT LOWER EXTREMITY

Common Femoral Vein: No evidence of thrombus. Normal
compressibility, respiratory phasicity and response to augmentation.

Central Greater Saphenous Vein: No evidence of thrombus. Normal
compressibility and flow on color Doppler imaging.

Central Profunda Femoral Vein: No evidence of thrombus. Normal
compressibility and flow on color Doppler imaging.

Femoral Vein: No evidence of thrombus. Normal compressibility,
respiratory phasicity and response to augmentation.

Popliteal Vein: No evidence of thrombus. Normal compressibility,
respiratory phasicity and response to augmentation.

Calf Veins: No evidence of thrombus. Normal compressibility and flow
on color Doppler imaging.

Other Findings: Heterogeneously hypoechoic, ellipsoid shaped,
subcutaneous fluid collection in the proximal, medial right calf
measuring approximately 8.6 x 1.3 x 4.3 cm. No evidence of active
extravasation or internal vascularity.

LEFT LOWER EXTREMITY

Common Femoral Vein: No evidence of thrombus. Normal
compressibility, respiratory phasicity and response to augmentation.
IMPRESSION: 1. No evidence of right lower extremity deep vein thrombosis.
2. Subcutaneous complex fluid collection in the right mid calf, most
compatible with hematoma. Recommend clinical correlation with
physical examination findings.

## 2021-12-09 ENCOUNTER — Encounter: Payer: Self-pay | Admitting: Family Medicine

## 2021-12-09 MED ORDER — NONFORMULARY OR COMPOUNDED ITEM: 3 refills | Status: DC

## 2021-12-18 ENCOUNTER — Encounter: Payer: Self-pay | Admitting: Family Medicine

## 2021-12-18 ENCOUNTER — Ambulatory Visit (INDEPENDENT_AMBULATORY_CARE_PROVIDER_SITE_OTHER): Payer: BC Managed Care – PPO | Admitting: Family Medicine

## 2021-12-18 VITALS — BP 108/68 | HR 84 | Temp 98.2°F | Ht 64.0 in | Wt 180.4 lb

## 2021-12-18 DIAGNOSIS — M0609 Rheumatoid arthritis without rheumatoid factor, multiple sites: Secondary | ICD-10-CM

## 2021-12-18 DIAGNOSIS — Z0000 Encounter for general adult medical examination without abnormal findings: Secondary | ICD-10-CM

## 2021-12-18 DIAGNOSIS — E039 Hypothyroidism, unspecified: Secondary | ICD-10-CM

## 2021-12-18 DIAGNOSIS — F339 Major depressive disorder, recurrent, unspecified: Secondary | ICD-10-CM

## 2021-12-18 LAB — COMPREHENSIVE METABOLIC PANEL
ALT: 12 U/L (ref 0–35)
AST: 13 U/L (ref 0–37)
Albumin: 3.8 g/dL (ref 3.5–5.2)
Alkaline Phosphatase: 52 U/L (ref 39–117)
BUN: 19 mg/dL (ref 6–23)
CO2: 23 mEq/L (ref 19–32)
Calcium: 8.7 mg/dL (ref 8.4–10.5)
Chloride: 105 mEq/L (ref 96–112)
Creatinine, Ser: 1.09 mg/dL (ref 0.40–1.20)
GFR: 61.6 mL/min (ref >60.00)
Glucose, Bld: 87 mg/dL (ref 70–99)
Potassium: 4.2 mEq/L (ref 3.5–5.1)
Sodium: 137 mEq/L (ref 135–145)
Total Bilirubin: 0.3 mg/dL (ref 0.2–1.2)
Total Protein: 6.7 g/dL (ref 6.0–8.3)

## 2021-12-18 LAB — T4, FREE: Free T4: 0.86 ng/dL (ref 0.60–1.60)

## 2021-12-18 LAB — CBC
HCT: 39.3 % (ref 36.0–46.0)
Hemoglobin: 13.2 g/dL (ref 12.0–15.0)
MCHC: 33.6 g/dL (ref 30.0–36.0)
MCV: 87 fl (ref 78.0–100.0)
Platelets: 340 10*3/uL (ref 150.0–400.0)
RBC: 4.51 Mil/uL (ref 3.87–5.11)
RDW: 13.5 % (ref 11.5–15.5)
WBC: 6.6 10*3/uL (ref 4.0–10.5)

## 2021-12-18 LAB — LIPID PANEL
Cholesterol: 205 mg/dL — ABNORMAL HIGH (ref 0–200)
HDL: 67.8 mg/dL (ref >39.00)
LDL Cholesterol: 110 mg/dL — ABNORMAL HIGH (ref 0–99)
NonHDL: 136.85
Total CHOL/HDL Ratio: 3
Triglycerides: 135 mg/dL (ref 0.0–149.0)
VLDL: 27 mg/dL (ref 0.0–40.0)

## 2021-12-18 LAB — TSH: TSH: 2.92 u[IU]/mL (ref 0.35–5.50)

## 2021-12-18 NOTE — Patient Instructions (Addendum)
Give us 2-3 business days to get the results of your labs back.   Keep the diet clean and stay active.  Please get me a copy of your advanced directive form at your convenience.   Let us know if you need anything.  

## 2021-12-18 NOTE — Progress Notes (Signed)
Chief Complaint  ?Patient presents with  ? Annual Exam  ?  ? ?Well Woman ?Marcia Rodgers is here for a complete physical.   ?Her last physical was >1 year ago.  ?Current diet: in general, a "healthy" diet. ?Current exercise: treadmill/elliptical. ?Weight is stable and she denies fatigue out of ordinary. ?Seatbelt? Yes ?Advanced directive? No ? ?Health Maintenance ?Pap/HPV- Yes ?Mammogram- Yes ?Tetanus- Yes ?Hep C screening- Yes ?HIV screening- Yes ? ?Past Medical History:  ?Diagnosis Date  ? Allergy   ? Anxiety   ? Arthritis   ? Gluten intolerance   ? Hypothyroidism   ? Rheumatoid arthritis (HCC)   ?  ? ?Past Surgical History:  ?Procedure Laterality Date  ? COSMETIC SURGERY    ? breast, arms, around stomach  ? ? ?Medications  ?Current Outpatient Medications on File Prior to Visit  ?Medication Sig Dispense Refill  ? buPROPion (WELLBUTRIN XL) 300 MG 24 hr tablet Take 1 tablet (300 mg total) by mouth daily. 90 tablet 2  ? levocetirizine (XYZAL) 5 MG tablet Take 1 tablet (5 mg total) by mouth every evening. 30 tablet 2  ? NONFORMULARY OR COMPOUNDED ITEM Levothyroxine SR 100 mcg 90 each 3  ? valACYclovir (VALTREX) 1000 MG tablet Take by mouth as needed.    ? ? ?Allergies ?Allergies  ?Allergen Reactions  ? Dairycare [Lactase-Lactobacillus] Other (See Comments)  ?  GI upset  ? Citric Acid   ?  AVOIDS ACIDIC FOODS  ? Gluten Meal Other (See Comments)  ?  rheumatoid arthritis, GI upset   ? ? ?Review of Systems: ?Constitutional:  no unexpected weight changes ?Eye:  no recent significant change in vision ?Ear/Nose/Mouth/Throat:  Ears:  no recent change in hearing ?Nose/Mouth/Throat:  no complaints of nasal congestion, no sore throat ?Cardiovascular: no chest pain ?Respiratory:  no shortness of breath ?Gastrointestinal:  no abdominal pain, no change in bowel habits ?GU:  Female: negative for dysuria or pelvic pain ?Musculoskeletal/Extremities:  no pain of the joints ?Integumentary (Skin/Breast):  no abnormal skin lesions  reported ?Neurologic:  no headaches ?Endocrine:  denies fatigue ?Hematologic/Lymphatic:  No areas of easy bleeding ? ?Exam ?BP 108/68   Pulse 84   Temp 98.2 ?F (36.8 ?C) (Oral)   Ht 5\' 4"  (1.626 m)   Wt 180 lb 6 oz (81.8 kg)   SpO2 98%   BMI 30.96 kg/m?  ?General:  well developed, well nourished, in no apparent distress ?Skin:  no significant moles, warts, or growths ?Head:  no masses, lesions, or tenderness ?Eyes:  pupils equal and round, sclera anicteric without injection ?Ears:  canals without lesions, TMs shiny without retraction, no obvious effusion, no erythema ?Nose:  nares patent, septum midline, mucosa normal, and no drainage or sinus tenderness ?Throat/Pharynx:  lips and gingiva without lesion; tongue and uvula midline; non-inflamed pharynx; no exudates or postnasal drainage ?Neck: neck supple without adenopathy, thyromegaly, or masses ?Lungs:  clear to auscultation, breath sounds equal bilaterally, no respiratory distress ?Cardio:  regular rate and rhythm, no LE edema ?Abdomen:  abdomen soft, nontender; bowel sounds normal; no masses or organomegaly ?Genital: Defer to GYN ?Musculoskeletal:  symmetrical muscle groups noted without atrophy or deformity ?Extremities:  no clubbing, cyanosis, or edema, no deformities, no skin discoloration ?Neuro:  gait normal; deep tendon reflexes normal and symmetric ?Psych: well oriented with normal range of affect and appropriate judgment/insight ? ?Assessment and Plan ? ?Well adult exam - Plan: CBC, Comprehensive metabolic panel, Lipid panel ? ?Hypothyroidism, unspecified type - Plan: TSH, T4, free  ? ?  Well 46 y.o. female. ?Counseled on diet and exercise. ?Other orders as above. ?Advanced directive form provided today.  ?Follow up in 6 mo or prn. ?The patient voiced understanding and agreement to the plan. ? ?Sharlene Dory, DO ?12/18/21 ?8:05 AM ? ?

## 2022-05-31 ENCOUNTER — Other Ambulatory Visit: Payer: Self-pay | Admitting: Family Medicine

## 2022-06-21 ENCOUNTER — Ambulatory Visit: Payer: BC Managed Care – PPO | Admitting: Family Medicine

## 2022-06-23 ENCOUNTER — Ambulatory Visit: Payer: BC Managed Care – PPO | Admitting: Family Medicine

## 2022-06-23 ENCOUNTER — Encounter: Payer: Self-pay | Admitting: Family Medicine

## 2022-06-23 VITALS — BP 124/80 | HR 83 | Temp 98.2°F | Ht 64.0 in | Wt 186.1 lb

## 2022-06-23 DIAGNOSIS — F339 Major depressive disorder, recurrent, unspecified: Secondary | ICD-10-CM

## 2022-06-23 DIAGNOSIS — Z1211 Encounter for screening for malignant neoplasm of colon: Secondary | ICD-10-CM

## 2022-06-23 DIAGNOSIS — E039 Hypothyroidism, unspecified: Secondary | ICD-10-CM

## 2022-06-23 NOTE — Progress Notes (Signed)
Chief Complaint  Patient presents with   Follow-up    Subjective Marcia Rodgers presents for f/u depression.  Pt is currently being treated with Wellbutrin XL 300 mg/d.  Reports doing well since treatment. No thoughts of harming self or others. No self-medication with alcohol, prescription drugs or illicit drugs. Pt is not following with a counselor/psychologist.  Hypothyroidism Patient presents for follow-up of hypothyroidism.  Reports compliance with medication- levothyroxine 100 mcg/d . Current symptoms include: denies fatigue out of the ordinary, weight changes, heat/cold intolerance, bowel/skin changes or CVS symptoms She believes her dose should be unchanged  Past Medical History:  Diagnosis Date   Allergy    Anxiety    Arthritis    Gluten intolerance    Hypothyroidism    Rheumatoid arthritis ([place])    Allergies as of 06/23/2022       Reactions   Dairycare [lactase-lactobacillus] Other (See Comments)   GI upset   Citric Acid    AVOIDS ACIDIC FOODS   Gluten Meal Other (See Comments)   rheumatoid arthritis, GI upset         Medication List        Accurate as of June 23, 2022  8:44 AM. If you have any questions, ask your nurse or doctor.          buPROPion 300 MG 24 hr tablet Commonly known as: WELLBUTRIN XL TAKE ONE TABLET BY MOUTH DAILY   levocetirizine 5 MG tablet Commonly known as: XYZAL Take 1 tablet (5 mg total) by mouth every evening.   NONFORMULARY OR COMPOUNDED ITEM Levothyroxine SR 100 mcg   valACYclovir 1000 MG tablet Commonly known as: VALTREX Take by mouth as needed.        Exam BP 124/80 (BP Location: Left Arm, Patient Position: Sitting, Cuff Size: Normal)   Pulse 83   Temp 98.2 F (36.8 C) (Oral)   Ht 5\' 4"  (1.626 m)   Wt 186 lb 2 oz (84.4 kg)   SpO2 96%   BMI 31.95 kg/m  General:  well developed, well nourished, in no apparent distress Heart: RRR Lungs:  CTAB. No respiratory distress Psych: well oriented with normal  range of affect and age-appropriate judgement/insight, alert and oriented x4.  Assessment and Plan  Depression, recurrent ([place])  Hypothyroidism, unspecified type  Screen for colon cancer  Chronic, stable. Cont Wellbutrin XL 300 mg/d.  Chronic, stable. Cont levothyroxine 100 mcg/d.  Cologard for CCS.  F/u in 6 mo or prn. The patient voiced understanding and agreement to the plan.  [place], DO 06/23/22 8:44 AM

## 2022-06-23 NOTE — Patient Instructions (Addendum)
Keep the diet clean and stay active.  If you do not hear anything about your Cologard/[employer] kit in the next 1-2 weeks, call our office and ask for an update.  Please consider counseling. Contact [phone] to schedule an appointment or inquire about cost/insurance coverage.  [facility] located at [place], [place], [place].  Phone number = [phone].  Dr. Lennice Sites - Adult Psychiatry.    [facility] located at [place], [place], [place]. Phone number = [phone].   [facility] located at [address], [place], [place].  Phone number = [phone].   [facility] located at [place], [place], [place].  Phone number = [phone].  Let us know if you need anything.

## 2022-12-06 ENCOUNTER — Encounter: Payer: Self-pay | Admitting: Family Medicine

## 2022-12-06 MED ORDER — NONFORMULARY OR COMPOUNDED ITEM: 3 refills | Status: DC

## 2022-12-24 ENCOUNTER — Encounter: Payer: BC Managed Care – PPO | Admitting: Family Medicine

## 2023-01-25 ENCOUNTER — Ambulatory Visit (INDEPENDENT_AMBULATORY_CARE_PROVIDER_SITE_OTHER): Payer: BC Managed Care – PPO | Admitting: Family Medicine

## 2023-01-25 ENCOUNTER — Encounter: Payer: Self-pay | Admitting: Family Medicine

## 2023-01-25 VITALS — BP 118/70 | HR 90 | Temp 98.1°F | Ht 64.5 in | Wt 186.1 lb

## 2023-01-25 DIAGNOSIS — Z2821 Immunization not carried out because of patient refusal: Secondary | ICD-10-CM

## 2023-01-25 DIAGNOSIS — Z0000 Encounter for general adult medical examination without abnormal findings: Secondary | ICD-10-CM | POA: Diagnosis not present

## 2023-01-25 DIAGNOSIS — Z1211 Encounter for screening for malignant neoplasm of colon: Secondary | ICD-10-CM

## 2023-01-25 DIAGNOSIS — E039 Hypothyroidism, unspecified: Secondary | ICD-10-CM

## 2023-01-25 NOTE — Progress Notes (Signed)
Chief Complaint  Patient presents with   Annual Exam     Well Woman Marcia Rodgers is here for a complete physical.   Her last physical was >1 year ago.  Current diet: in general, trying to get better w her diet. Current exercise: walking. Weight is stable and she denies fatigue out of ordinary. Seatbelt? Yes Advanced directive? No  Health Maintenance Pap/HPV- Yes Mammogram- Yes Tetanus- Yes Hep C screening- Yes HIV screening- Yes  Past Medical History:  Diagnosis Date   Allergy    Anxiety    Arthritis    Gluten intolerance    Hypothyroidism    Rheumatoid arthritis (HCC)      Past Surgical History:  Procedure Laterality Date   COSMETIC SURGERY     breast, arms, around stomach    Medications  Current Outpatient Medications on File Prior to Visit  Medication Sig Dispense Refill   buPROPion (WELLBUTRIN XL) 300 MG 24 hr tablet TAKE ONE TABLET BY MOUTH DAILY 90 tablet 2   levocetirizine (XYZAL) 5 MG tablet Take 1 tablet (5 mg total) by mouth every evening. 30 tablet 2   NONFORMULARY OR COMPOUNDED ITEM Levothyroxine SR 100 mcg 90 each 3   valACYclovir (VALTREX) 1000 MG tablet Take by mouth as needed.      Allergies Allergies  Allergen Reactions   Dairycare [Lactase-Lactobacillus] Other (See Comments)    GI upset   Citric Acid     AVOIDS ACIDIC FOODS   Gluten Meal Other (See Comments)    rheumatoid arthritis, GI upset     Review of Systems: Constitutional:  no unexpected weight changes Eye:  no recent significant change in vision Ear/Nose/Mouth/Throat:  Ears:  no recent change in hearing Nose/Mouth/Throat:  no complaints of nasal congestion, no sore throat Cardiovascular: no chest pain Respiratory:  no shortness of breath Gastrointestinal:  no abdominal pain, no change in bowel habits GU:  Female: negative for dysuria or pelvic pain Musculoskeletal/Extremities: +L knee swelling/pain Integumentary (Skin/Breast):  no abnormal skin lesions reported Neurologic:   no headaches Endocrine:  denies fatigue Hematologic/Lymphatic:  No areas of easy bleeding  Exam BP 118/70 (BP Location: Left Arm, Patient Position: Sitting, Cuff Size: Normal)   Pulse 90   Temp 98.1 F (36.7 C) (Oral)   Ht 5' 4.5" (1.638 m)   Wt 186 lb 2 oz (84.4 kg)   SpO2 97%   BMI 31.45 kg/m  General:  well developed, well nourished, in no apparent distress Skin:  no significant moles, warts, or growths Head:  no masses, lesions, or tenderness Eyes:  pupils equal and round, sclera anicteric without injection Ears:  canals without lesions, TMs shiny without retraction, no obvious effusion, no erythema Nose:  nares patent, mucosa normal, and no drainage Throat/Pharynx:  lips and gingiva without lesion; tongue and uvula midline; non-inflamed pharynx; no exudates or postnasal drainage Neck: neck supple without adenopathy, thyromegaly, or masses Lungs:  clear to auscultation, breath sounds equal bilaterally, no respiratory distress Cardio:  regular rate and rhythm, no LE edema Abdomen:  abdomen soft, nontender; bowel sounds normal; no masses or organomegaly Genital: Defer to GYN Musculoskeletal: Left knee: Decreased active/passive knee flexion/extension.  No obvious deformity.  Soft tissue edema noted more probably on the left.  Mild TTP over the lateral anterior joint line.  Negative Lachman's, varus/valgus stress, patellar apprehension/grind, McMurray's, Steinmann's.  Symmetrical muscle groups noted without atrophy or deformity Extremities:  no clubbing, cyanosis, or edema, no deformities, no skin discoloration Neuro:  gait normal; deep  tendon reflexes normal and symmetric Psych: well oriented with normal range of affect and appropriate judgment/insight  Assessment and Plan  Well adult exam - Plan: Lipid panel, Comprehensive metabolic panel, CBC  Colon cancer screening - Plan: Cologuard  Hypothyroidism, unspecified type - Plan: T4, free, TSH  Tetanus, diphtheria, and acellular  pertussis (Tdap) vaccination declined   Well 47 y.o. female. Counseled on diet and exercise. Advanced directive form provided today.  CCS: Cologard ordered.  Discussed risks and benefits of colonoscopy versus Cologuard. She politely declines a tetanus shot. Other orders as above. For her right knee pain, possibly patellofemoral versus meniscal irritation.  Stretches and exercises, if no improvement will refer to sports medicine. Follow up in 6 mo. she will come back for fasting labs for today. The patient voiced understanding and agreement to the plan.  Marcia Roche [place], DO 01/25/23 10:07 AM

## 2023-01-25 NOTE — Patient Instructions (Addendum)
Give [place] 2-3 business days to get the results of your labs back.   Keep the diet clean and stay active.  Please get me a copy of your advanced directive form at your convenience.   Send me a message if your knee is not improving in the next month.   Let us know if you need anything.  Knee Exercises It is normal to feel mild stretching, pulling, tightness, or discomfort as you do these exercises, but you should stop right away if you feel sudden pain or your pain gets worse.  STRETCHING AND RANGE OF MOTION EXERCISES  These exercises warm up your muscles and joints and improve the movement and flexibility of your knee. These exercises also help to relieve pain, numbness, and tingling. Exercise A: Knee Extension, Prone  Lie on your abdomen on a bed. Place your left / right knee just beyond the edge of the surface so your knee is not on the bed. You can put a towel under your left / right thigh just above your knee for comfort. Relax your leg muscles and allow gravity to straighten your knee. You should feel a stretch behind your left / right knee. Hold this position for 30 seconds. Scoot up so your knee is supported between repetitions. Repeat 2 times. Complete this stretch 3 times per week. Exercise B: Knee Flexion, Active     Lie on your back with both knees straight. If this causes back discomfort, bend your left / right knee so your foot is flat on the floor. Slowly slide your left / right heel back toward your buttocks until you feel a gentle stretch in the front of your knee or thigh. Hold this position for 30 seconds. Slowly slide your left / right heel back to the starting position. Repeat 2 times. Complete this exercise 3 times per week. Exercise C: Quadriceps, Prone     Lie on your abdomen on a firm surface, such as a bed or padded floor. Bend your left / right knee and hold your ankle. If you cannot reach your ankle or pant leg, loop a belt around your foot and grab the belt  instead. Gently pull your heel toward your buttocks. Your knee should not slide out to the side. You should feel a stretch in the front of your thigh and knee. Hold this position for 30 seconds. Repeat 2 times. Complete this stretch 3 times per week. Exercise D: Hamstring, Supine  Lie on your back. Loop a belt or towel over the ball of your left / right foot. The ball of your foot is on the walking surface, right under your toes. Straighten your left / right knee and slowly pull on the belt to raise your leg until you feel a gentle stretch behind your knee. Do not let your left / right knee bend while you do this. Keep your other leg flat on the floor. Hold this position for 30 seconds. Repeat 2 times. Complete this stretch 3 times per week. STRENGTHENING EXERCISES  These exercises build strength and endurance in your knee. Endurance is the ability to use your muscles for a long time, even after they get tired. Exercise E: Quadriceps, Isometric     Lie on your back with your left / right leg extended and your other knee bent. Put a rolled towel or small pillow under your knee if told by your health care provider. Slowly tense the muscles in the front of your left / right thigh. You  should see your kneecap slide up toward your hip or see increased dimpling just above the knee. This motion will push the back of the knee toward the floor. For 3 seconds, keep the muscle as tight as you can without increasing your pain. Relax the muscles slowly and completely. Repeat for 10 total reps Repeat 2 ti mes. Complete this exercise 3 times per week. Exercise F: Straight Leg Raises - Quadriceps  Lie on your back with your left / right leg extended and your other knee bent. Tense the muscles in the front of your left / right thigh. You should see your kneecap slide up or see increased dimpling just above the knee. Your thigh may even shake a bit. Keep these muscles tight as you raise your leg 4-6 inches  (10-15 cm) off the floor. Do not let your knee bend. Hold this position for 3 seconds. Keep these muscles tense as you lower your leg. Relax your muscles slowly and completely after each repetition. 10 total reps. Repeat 2 times. Complete this exercise 3 times per week.  Exercise G: Hamstring Curls     If told by your health care provider, do this exercise while wearing ankle weights. Begin with 5 lb weights (optional). Then increase the weight by 1 lb (0.5 kg) increments. Do not wear ankle weights that are more than 20 lbs to start with. Lie on your abdomen with your legs straight. Bend your left / right knee as far as you can without feeling pain. Keep your hips flat against the floor. Hold this position for 3 seconds. Slowly lower your leg to the starting position. Repeat for 10 reps.  Repeat 2 times. Complete this exercise 3 times per week. Exercise H: Squats (Quadriceps)  Stand in front of a table, with your feet and knees pointing straight ahead. You may rest your hands on the table for balance but not for support. Slowly bend your knees and lower your hips like you are going to sit in a chair. Keep your weight over your heels, not over your toes. Keep your lower legs upright so they are parallel with the table legs. Do not let your hips go lower than your knees. Do not bend lower than told by your health care provider. If your knee pain increases, do not bend as low. Hold the squat position for 1 second. Slowly push with your legs to return to standing. Do not use your hands to pull yourself to standing. Repeat 2 times. Complete this exercise 3 times per week. Exercise I: Wall Slides (Quadriceps)     Lean your back against a smooth wall or door while you walk your feet out 18-24 inches (46-61 cm) from it. Place your feet hip-width apart. Slowly slide down the wall or door until your knees Repeat 2 times. Complete this exercise every other day. Exercise K: Straight Leg Raises -  Hip Abductors  Lie on your side with your left / right leg in the top position. Lie so your head, shoulder, knee, and hip line up. You may bend your bottom knee to help you keep your balance. Roll your hips slightly forward so your hips are stacked directly over each other and your left / right knee is facing forward. Leading with your heel, lift your top leg 4-6 inches (10-15 cm). You should feel the muscles in your outer hip lifting. Do not let your foot drift forward. Do not let your knee roll toward the ceiling. Hold this position for  3 seconds. Slowly return your leg to the starting position. Let your muscles relax completely after each repetition. 10 total reps. Repeat 2 times. Complete this exercise 3 times per week. Exercise J: Straight Leg Raises - Hip Extensors  Lie on your abdomen on a firm surface. You can put a pillow under your hips if that is more comfortable. Tense the muscles in your buttocks and lift your left / right leg about 4-6 inches (10-15 cm). Keep your knee straight as you lift your leg. Hold this position for 3 seconds. Slowly lower your leg to the starting position. Let your leg relax completely after each repetition. Repeat 2 times. Complete this exercise 3 times per week. Document Released: 06/30/2005 Document Revised: 05/10/2016 Document Reviewed: 06/22/2015 Elsevier Interactive Patient Education  2017 [employer].

## 2023-01-27 ENCOUNTER — Other Ambulatory Visit (INDEPENDENT_AMBULATORY_CARE_PROVIDER_SITE_OTHER): Payer: BC Managed Care – PPO

## 2023-01-27 DIAGNOSIS — Z0000 Encounter for general adult medical examination without abnormal findings: Secondary | ICD-10-CM

## 2023-01-27 DIAGNOSIS — E039 Hypothyroidism, unspecified: Secondary | ICD-10-CM | POA: Diagnosis not present

## 2023-01-27 LAB — CBC
HCT: 39.6 % (ref 36.0–46.0)
Hemoglobin: 13 g/dL (ref 12.0–15.0)
MCHC: 32.7 g/dL (ref 30.0–36.0)
MCV: 86.5 fl (ref 78.0–100.0)
Platelets: 403 10*3/uL — ABNORMAL HIGH (ref 150.0–400.0)
RBC: 4.58 Mil/uL (ref 3.87–5.11)
RDW: 13.5 % (ref 11.5–15.5)
WBC: 7.4 10*3/uL (ref 4.0–10.5)

## 2023-01-27 LAB — COMPREHENSIVE METABOLIC PANEL
ALT: 14 U/L (ref 0–35)
AST: 16 U/L (ref 0–37)
Albumin: 3.8 g/dL (ref 3.5–5.2)
Alkaline Phosphatase: 69 U/L (ref 39–117)
BUN: 18 mg/dL (ref 6–23)
CO2: 24 mEq/L (ref 19–32)
Calcium: 8.7 mg/dL (ref 8.4–10.5)
Chloride: 103 mEq/L (ref 96–112)
Creatinine, Ser: 0.98 mg/dL (ref 0.40–1.20)
GFR: 69.44 mL/min (ref >60.00)
Glucose, Bld: 94 mg/dL (ref 70–99)
Potassium: 4.2 mEq/L (ref 3.5–5.1)
Sodium: 137 mEq/L (ref 135–145)
Total Bilirubin: 0.3 mg/dL (ref 0.2–1.2)
Total Protein: 7.2 g/dL (ref 6.0–8.3)

## 2023-01-27 LAB — LIPID PANEL
Cholesterol: 218 mg/dL — ABNORMAL HIGH (ref 0–200)
HDL: 75.7 mg/dL (ref >39.00)
LDL Cholesterol: 118 mg/dL — ABNORMAL HIGH (ref 0–99)
NonHDL: 142.41
Total CHOL/HDL Ratio: 3
Triglycerides: 120 mg/dL (ref 0.0–149.0)
VLDL: 24 mg/dL (ref 0.0–40.0)

## 2023-01-27 LAB — TSH: TSH: 4.31 u[IU]/mL (ref 0.35–5.50)

## 2023-01-27 LAB — T4, FREE: Free T4: 0.84 ng/dL (ref 0.60–1.60)

## 2023-01-28 ENCOUNTER — Other Ambulatory Visit: Payer: Self-pay | Admitting: Family Medicine

## 2023-01-28 DIAGNOSIS — D691 Qualitative platelet defects: Secondary | ICD-10-CM

## 2023-02-11 ENCOUNTER — Other Ambulatory Visit (INDEPENDENT_AMBULATORY_CARE_PROVIDER_SITE_OTHER): Payer: BC Managed Care – PPO

## 2023-02-11 DIAGNOSIS — D691 Qualitative platelet defects: Secondary | ICD-10-CM

## 2023-02-11 LAB — CBC WITH DIFFERENTIAL/PLATELET
Absolute Monocytes: 855 cells/uL (ref 200–950)
Basophils Relative: 0.4 %
Hemoglobin: 14.2 g/dL (ref 11.7–15.5)
Lymphs Abs: 2632 cells/uL (ref 850–3900)
MCH: 28.3 pg (ref 27.0–33.0)
Neutro Abs: 5668 cells/uL (ref 1500–7800)
Platelets: 400 10*3/uL (ref 140–400)
RBC: 5.02 10*6/uL (ref 3.80–5.10)
Total Lymphocyte: 28 %

## 2023-02-11 NOTE — Addendum Note (Signed)
Addended by: Maximino Sarin on: 02/11/2023 03:25 PM   Modules accepted: Orders

## 2023-02-11 NOTE — Addendum Note (Signed)
Addended by: Maximino Sarin on: 02/11/2023 09:56 AM   Modules accepted: Orders

## 2023-02-11 NOTE — Addendum Note (Signed)
Addended by: Maximino Sarin on: 02/11/2023 09:58 AM   Modules accepted: Orders

## 2023-02-14 LAB — CBC WITH DIFFERENTIAL/PLATELET
Basophils Absolute: 38 cells/uL (ref 0–200)
Eosinophils Absolute: 207 cells/uL (ref 15–500)
Eosinophils Relative: 2.2 %
HCT: 42.8 % (ref 35.0–45.0)
MCHC: 33.2 g/dL (ref 32.0–36.0)
MCV: 85.3 fL (ref 80.0–100.0)
MPV: 8.9 fL (ref 7.5–12.5)
Monocytes Relative: 9.1 %
Neutrophils Relative %: 60.3 %
RDW: 13.1 % (ref 11.0–15.0)
WBC: 9.4 10*3/uL (ref 3.8–10.8)

## 2023-02-14 LAB — PATHOLOGIST SMEAR REVIEW

## 2023-02-23 LAB — COLOGUARD: COLOGUARD: NEGATIVE

## 2023-03-09 ENCOUNTER — Encounter: Payer: Self-pay | Admitting: Family Medicine

## 2023-03-09 MED ORDER — BUPROPION HCL ER (XL) 300 MG PO TB24: 300.0000 mg | ORAL_TABLET | Freq: Every day | ORAL | 2 refills | Status: DC

## 2023-05-17 ENCOUNTER — Ambulatory Visit: Payer: BC Managed Care – PPO | Admitting: Family Medicine

## 2023-05-17 VITALS — BP 130/85 | HR 80 | Temp 98.9°F | Ht 64.5 in | Wt 178.4 lb

## 2023-05-17 DIAGNOSIS — E162 Hypoglycemia, unspecified: Secondary | ICD-10-CM | POA: Diagnosis not present

## 2023-05-17 NOTE — Progress Notes (Signed)
Chief Complaint  Patient presents with   Hypoglycemia    Subjective: Patient is a 47 y.o. female here for low sugars.  Pt has a 24 yr hx of low sugars. She will eat something sweet and symptoms of sweating, rapid heartbeat, balance being off and lightheadedness/difficulty concentrating are resolved. She had a GI illness a few days ago and things worsened. That illness is steadily improving. She has been tested for DM and it has been neg on several occasions.   Past Medical History:  Diagnosis Date   Allergy    Anxiety    Arthritis    Gluten intolerance    Hypothyroidism    Rheumatoid arthritis (HCC)     Objective: BP 130/85 (BP Location: Left Arm, Patient Position: Sitting, Cuff Size: Normal)   Pulse 80   Temp 98.9 F (37.2 C) (Oral)   Ht 5' 4.5" (1.638 m)   Wt 178 lb 6 oz (80.9 kg)   SpO2 98%   BMI 30.15 kg/m  General: Awake, appears stated age Heart: RRR, no LE edema Lungs: CTAB, no rales, wheezes or rhonchi. No accessory muscle use Psych: Age appropriate judgment and insight, normal affect and mood  Assessment and Plan: Hypoglycemia - Plan: Basic metabolic panel, TSH, Insulin, random, C-peptide, Proinsulin  Eat freq meals, check above. If concern for very high endog insulin, will ck MRI to r/o an insulinoma. The patient voiced understanding and agreement to the plan.  Jilda Roche [place], DO 05/17/23  2:55 PM

## 2023-05-17 NOTE — Patient Instructions (Signed)
Eat frequent meals.   Give [place] 5-6 business days to get all of your results back.   Let us know if you need anything.

## 2023-05-25 LAB — PROINSULIN: Proinsulin: 12.9 pmol/L (ref <=18.8)

## 2023-05-25 LAB — C-PEPTIDE: C-Peptide: 2.47 ng/mL (ref 0.80–3.85)

## 2023-05-25 LAB — INSULIN, RANDOM: Insulin: 12.9 u[IU]/mL

## 2023-08-01 ENCOUNTER — Ambulatory Visit: Payer: BC Managed Care – PPO | Admitting: Family Medicine

## 2023-08-01 VITALS — BP 126/78 | HR 93 | Temp 97.9°F | Resp 16 | Ht 64.0 in | Wt 173.0 lb

## 2023-08-01 DIAGNOSIS — F325 Major depressive disorder, single episode, in full remission: Secondary | ICD-10-CM | POA: Diagnosis not present

## 2023-08-01 DIAGNOSIS — E039 Hypothyroidism, unspecified: Secondary | ICD-10-CM | POA: Diagnosis not present

## 2023-08-01 DIAGNOSIS — F411 Generalized anxiety disorder: Secondary | ICD-10-CM

## 2023-08-01 LAB — TSH: TSH: 4.61 u[IU]/mL (ref 0.35–5.50)

## 2023-08-01 LAB — T4, FREE: Free T4: 0.91 ng/dL (ref 0.60–1.60)

## 2023-08-01 NOTE — Progress Notes (Signed)
Chief Complaint  Patient presents with   Medication Refill    Medication check    Subjective Marcia Rodgers presents for f/u anxiety/depression.  Pt is currently being treated with Wellbutrin XL 300 mg/d.  Reports doing well since treatment. No thoughts of harming self or others. No self-medication with alcohol, prescription drugs or illicit drugs. Pt is not following with a counselor/psychologist.  Hypothyroidism Patient presents for follow-up of hypothyroidism.  Reports compliance with medication- levothyroxine 100 mcg/d. Current symptoms include: denies fatigue, weight changes, heat/cold intolerance, bowel/skin changes or CVS symptoms She believes her dose should be not significantly changed  Past Medical History:  Diagnosis Date   Allergy    Anxiety    Arthritis    Gluten intolerance    Hypothyroidism    Rheumatoid arthritis (HCC)    Allergies as of 08/01/2023       Reactions   Dairycare [bacid] Other (See Comments)   GI upset   Citric Acid    AVOIDS ACIDIC FOODS   Gluten Meal Other (See Comments)   rheumatoid arthritis, GI upset         Medication List        Accurate as of August 01, 2023  8:41 AM. If you have any questions, ask your nurse or doctor.          buPROPion 300 MG 24 hr tablet Commonly known as: WELLBUTRIN XL Take 1 tablet (300 mg total) by mouth daily.   levocetirizine 5 MG tablet Commonly known as: XYZAL Take 1 tablet (5 mg total) by mouth every evening.   [name] 3-0.02 MG tablet Generic drug: drospirenone-ethinyl estradiol Take 1 tablet by mouth daily.   NONFORMULARY OR COMPOUNDED ITEM Levothyroxine SR 100 mcg   valACYclovir 1000 MG tablet Commonly known as: VALTREX Take by mouth as needed.        Exam BP 126/78 (BP Location: Left Arm, Patient Position: Sitting, Cuff Size: Normal)   Pulse 93   Temp 97.9 F (36.6 C) (Oral)   Resp 16   Ht 5\' 4"  (1.626 m)   Wt 173 lb (78.5 kg)   SpO2 98%   BMI 29.70 kg/m  General:   well developed, well nourished, in no apparent distress Heart: RRR Lungs: CTAB. No respiratory distress Psych: well oriented with normal range of affect and age-appropriate judgement/insight, alert and oriented x4.  Assessment and Plan  GAD (generalized anxiety disorder)  Depression, major, single episode, complete remission (HCC)  Hypothyroidism, unspecified type - Plan: TSH, T4, free  1/2. Chronic, stable. Cont Wellbutrin XL 300 mg/d.  3. Chronic, stable. Cont levothyroxine 100 mcg/d.  Tdap politely declined.  F/u in 6 mo. The patient voiced understanding and agreement to the plan.  Jilda Roche [place], DO 08/01/23 8:41 AM

## 2023-08-01 NOTE — Patient Instructions (Signed)
Give us 2-3 business days to get the results of your labs back.   Keep the diet clean and stay active.  Let us know if you need anything. 

## 2023-11-28 ENCOUNTER — Encounter: Payer: Self-pay | Admitting: Family Medicine

## 2023-11-28 ENCOUNTER — Other Ambulatory Visit: Payer: Self-pay | Admitting: Family Medicine

## 2023-11-28 NOTE — Telephone Encounter (Signed)
 Copied from CRM [id number]. Topic: Clinical - Medication Refill >> Nov 28, 2023  4:36 PM Eunice Blase wrote: Most Recent Primary Care Visit:  Provider: Sharlene Dory  Department: [facility]  Visit Type: OFFICE VISIT  Date: 08/01/2023  Medication: Levothyroxine SR 100 mcg  Has the patient contacted their pharmacy? Yes (Agent: If no, request that the patient contact the pharmacy for the refill. If patient does not wish to contact the pharmacy document the reason why and proceed with request.) (Agent: If yes, when and what did the pharmacy advise?)Pharmacy need approval from PCP  Is this the correct pharmacy for this prescription? Yes If no, delete pharmacy and type the correct one.  This is the patient's preferred pharmacy:  [facility] - [place], [place] - [address] [place] [address] Phone: [phone] Fax: [phone]  Has the prescription been filled recently? Yes  Is the patient out of the medication? Yes  Has the patient been seen for an appointment in the last year OR does the patient have an upcoming appointment? Yes  Can we respond through MyChart? Yes  Agent: Please be advised that Rx refills may take up to 3 business days. We ask that you follow-up with your pharmacy.

## 2023-11-29 ENCOUNTER — Telehealth: Payer: Self-pay

## 2023-11-29 ENCOUNTER — Other Ambulatory Visit: Payer: Self-pay

## 2023-11-29 MED ORDER — NONFORMULARY OR COMPOUNDED ITEM: 3 refills | Status: DC

## 2023-11-29 NOTE — Telephone Encounter (Signed)
 Open to refill med

## 2023-11-29 NOTE — Telephone Encounter (Signed)
Last filled in 2021.

## 2023-11-29 NOTE — Telephone Encounter (Signed)
 Disregard pt came into office to pick up script

## 2023-12-08 ENCOUNTER — Other Ambulatory Visit: Payer: Self-pay | Admitting: Family Medicine

## 2023-12-26 ENCOUNTER — Ambulatory Visit: Payer: Self-pay

## 2023-12-26 NOTE — Telephone Encounter (Signed)
 Copied from CRM [id number]. Topic: Clinical - Red Word Triage >> Dec 26, 2023  8:32 AM Howard Macho wrote: Red Word that prompted transfer to Nurse Triage: patient is having severe stomach pain on and off for a week  Chief Complaint: abd pain Symptoms: severe abd pain that comes and goes in rib cage/abd area Frequency: x 1 week Pertinent Negatives: Patient denies fever, n/v Disposition: [] ED /[] Urgent Care (no appt availability in office) / [x] Appointment(In office/virtual)/ []  [place] [facility]/ [] Home Care/ [] Refused Recommended Disposition /[] [place] [facility]/ []  Follow-up with PCP Additional Notes: 1st time lasted about 20 minutes.  2nd time the pain lasted longer & was unable to move due to pain being so intense. Pain comes and goes and none now.  Scheduled in office visit for evaluation and treatment.  Answer Assessment - Initial Assessment Questions 1. LOCATION: "Where does it hurt?"      Abd pain / rib area - where your bra sits 2. RADIATION: "Does the pain shoot anywhere else?" (e.g., chest, back)     no 3. ONSET: "When did the pain begin?" (e.g., minutes, hours or days ago)      X 1 week 4. SUDDEN: "Gradual or sudden onset?"     gradual 5. PATTERN "Does the pain come and go, or is it constant?"    - If it comes and goes: "How long does it last?" "Do you have pain now?"     (Note: Comes and goes means the pain is intermittent. It goes away completely between bouts.)    - If constant: "Is it getting better, staying the same, or getting worse?"      (Note: Constant means the pain never goes away completely; most serious pain is constant and gets worse.)      Comes and goes  6. SEVERITY: "How bad is the pain?"  (e.g., Scale 1-10; mild, moderate, or severe)    - MILD (1-3): Doesn't interfere with normal activities, abdomen soft and not tender to touch.     - MODERATE (4-7): Interferes with normal activities or awakens from sleep, abdomen tender to touch.     - SEVERE (8-10):  Excruciating pain, doubled over, unable to do any normal activities.       8.5 /10 pain 7. RECURRENT SYMPTOM: "Have you ever had this type of stomach pain before?" If Yes, ask: "When was the last time?" and "What happened that time?"      no 8. CAUSE: "What do you think is causing the stomach pain?"     unknown 9. RELIEVING/AGGRAVATING FACTORS: "What makes it better or worse?" (e.g., antacids, bending or twisting motion, bowel movement)   Had to sit completely still due to pain 10. OTHER SYMPTOMS: "Do you have any other symptoms?" (e.g., back pain, diarrhea, fever, urination pain, vomiting)       no 11. PREGNANCY: "Is there any chance you are pregnant?" "When was your last menstrual period?"       N/a  Protocols used: Abdominal Pain - [facility]

## 2023-12-27 ENCOUNTER — Ambulatory Visit: Admitting: Family Medicine

## 2023-12-27 VITALS — BP 128/76 | HR 106 | Temp 98.0°F | Resp 16 | Ht 64.0 in | Wt 173.0 lb

## 2023-12-27 DIAGNOSIS — R1011 Right upper quadrant pain: Secondary | ICD-10-CM

## 2023-12-27 MED ORDER — DICYCLOMINE HCL 10 MG PO CAPS: ORAL_CAPSULE | ORAL | 0 refills | Status: DC

## 2023-12-27 NOTE — Progress Notes (Signed)
 Chief Complaint  Patient presents with   Abdominal Pain    Abdominal Pain     Subjective Marcia Rodgers is a 48 y.o. female who presents with bilateral upper abd pain.  Felt like a spasm Symptoms began a little over a week ago.  She had 2 episodes lasting around 20 minutes each. Patient has no associated ss's w this.  Chronic nausea.  Radiation to her back.  Patient denies vomiting, fever, myalgias, rashes, and URI symptoms Treatment to date: ibuprofen, Gas-X equivalent; neither were particularly helpful Sick contacts: none known  Past Medical History:  Diagnosis Date   Allergy    Anxiety    Arthritis    Gluten intolerance    Hypothyroidism    Rheumatoid arthritis (HCC)     Exam BP 128/76 (BP Location: Left Arm, Patient Position: Sitting)   Pulse (!) 106   Temp 98 F (36.7 C) (Oral)   Resp 16   Ht 5\' 4"  (1.626 m)   Wt 173 lb (78.5 kg)   SpO2 99%   BMI 29.70 kg/m  General:  well developed, well hydrated, in no apparent distress Skin:  warm, no pallor or diaphoresis, no rashes Throat/Pharynx:  lips and gingiva without lesion; tongue and uvula midline; non-inflamed pharynx; no exudates or postnasal drainage Lungs:  clear to auscultation, breath sounds equal bilaterally, no respiratory distress, no wheezes Cardio:  RRR Abdomen:  abdomen soft, + TTP in the right upper quadrant region, negative Murphy's, McBurney's, Rovsing's, Carnett's; bowel sounds normal; no masses or organomegaly Psych: Appropriate judgement/insight  Assessment and Plan  RUQ pain - Plan: US  ABDOMEN LIMITED RUQ (LIVER/GB), dicyclomine (BENTYL) 10 MG capsule  Could be musculoskeletal.  Bentyl as needed.  Check gallbladder ultrasound.  If negative, would monitor for recurrence and proceed accordingly.  Stay hydrated. Follow-up as originally scheduled. The patient voiced understanding and agreement to the plan.  Shellie Dials [place], DO 12/27/23  3:03 PM

## 2023-12-27 NOTE — Patient Instructions (Addendum)
 Someone will reach out to schedule your US .   Stay hydrated.  Ice/cold pack over area for 10-15 min twice daily.  Heat (pad or rice pillow in microwave) over affected area, 10-15 minutes twice daily.   OK to take Tylenol 1000 mg (2 extra strength tabs) or 975 mg (3 regular strength tabs) every 6 hours as needed.  Let us  know if you need anything.

## 2024-01-05 ENCOUNTER — Encounter: Payer: Self-pay | Admitting: Family Medicine

## 2024-01-05 ENCOUNTER — Ambulatory Visit (HOSPITAL_BASED_OUTPATIENT_CLINIC_OR_DEPARTMENT_OTHER)
Admission: RE | Admit: 2024-01-05 | Discharge: 2024-01-05 | Disposition: A | Discharge status: Home / Self Care | Source: Ambulatory Visit | Attending: Family Medicine | Admitting: Family Medicine

## 2024-01-05 DIAGNOSIS — R1011 Right upper quadrant pain: Secondary | ICD-10-CM | POA: Diagnosis present

## 2024-01-06 ENCOUNTER — Other Ambulatory Visit: Payer: Self-pay | Admitting: Family Medicine

## 2024-01-06 DIAGNOSIS — K8020 Calculus of gallbladder without cholecystitis without obstruction: Secondary | ICD-10-CM

## 2024-01-20 ENCOUNTER — Other Ambulatory Visit: Payer: Self-pay

## 2024-01-20 DIAGNOSIS — K8020 Calculus of gallbladder without cholecystitis without obstruction: Secondary | ICD-10-CM

## 2024-01-30 ENCOUNTER — Ambulatory Visit (INDEPENDENT_AMBULATORY_CARE_PROVIDER_SITE_OTHER): Payer: BC Managed Care – PPO | Admitting: Family Medicine

## 2024-01-30 ENCOUNTER — Encounter: Payer: Self-pay | Admitting: Family Medicine

## 2024-01-30 VITALS — BP 126/74 | HR 73 | Temp 98.0°F | Resp 16 | Ht 64.0 in | Wt 167.0 lb

## 2024-01-30 DIAGNOSIS — E039 Hypothyroidism, unspecified: Secondary | ICD-10-CM | POA: Diagnosis not present

## 2024-01-30 DIAGNOSIS — Z0000 Encounter for general adult medical examination without abnormal findings: Secondary | ICD-10-CM

## 2024-01-30 NOTE — Progress Notes (Signed)
 CC: CPE   Well Woman Marcia Rodgers is here for a complete physical.   Her last physical was >1 year ago.  Current diet: in general, a "healthy" diet. Current exercise: none- having symptomatic gallstones. Weight is decreasing due to above and she denies fatigue out of ordinary. Seatbelt? Yes Advanced directive? No  Health Maintenance Pap/HPV- Yes CCS- Yes Mammogram- Yes Tetanus- Declined Hep C screening- Yes HIV screening- Yes  Past Medical History:  Diagnosis Date   Allergy    Anxiety    Arthritis    Gluten intolerance    Hypothyroidism    Rheumatoid arthritis (HCC)      Past Surgical History:  Procedure Laterality Date   COSMETIC SURGERY     breast, arms, around stomach    Medications  Current Outpatient Medications on File Prior to Visit  Medication Sig Dispense Refill   buPROPion  (WELLBUTRIN  XL) 300 MG 24 hr tablet Take 1 tablet (300 mg total) by mouth daily. 90 tablet 0   dicyclomine  (BENTYL ) 10 MG capsule Take 1 tab every 6 hours as needed for abdominal cramping. 30 capsule 0   drospirenone-ethinyl estradiol ([name]) 3-0.02 MG tablet Take 1 tablet by mouth daily.     levocetirizine (XYZAL ) 5 MG tablet Take 1 tablet (5 mg total) by mouth every evening. 30 tablet 2   NONFORMULARY OR COMPOUNDED ITEM Levothyroxine  SR 100 mcg 90 each 3   valACYclovir (VALTREX) 1000 MG tablet Take by mouth as needed.     Allergies Allergies  Allergen Reactions   Dairycare [Bacid] Other (See Comments)    GI upset   Citric Acid     AVOIDS ACIDIC FOODS   Gluten Meal Other (See Comments)    rheumatoid arthritis, GI upset     Review of Systems: Constitutional:  no unexpected weight changes Eye:  no recent significant change in vision Ear/Nose/Mouth/Throat:  Ears:  no recent change in hearing Nose/Mouth/Throat:  no complaints of nasal congestion, no sore throat Cardiovascular: no chest pain Respiratory:  no shortness of breath Gastrointestinal:  no new abdominal pain, no change  in bowel habits GU:  Female: negative for dysuria or pelvic pain Musculoskeletal/Extremities:  no pain of the joints Integumentary (Skin/Breast):  no abnormal skin lesions reported Neurologic:  no headaches Endocrine:  denies fatigue Hematologic/Lymphatic:  No areas of easy bleeding  Exam BP 126/74 (BP Location: Left Arm, Patient Position: Sitting)   Pulse 73   Temp 98 F (36.7 C) (Oral)   Resp 16   Ht 5\' 4"  (1.626 m)   Wt 167 lb (75.8 kg)   SpO2 96%   BMI 28.67 kg/m  General:  well developed, well nourished, in no apparent distress Skin:  no significant moles, warts, or growths Head:  no masses, lesions, or tenderness Eyes:  pupils equal and round, sclera anicteric without injection Ears:  canals without lesions, TMs shiny without retraction, no obvious effusion, no erythema Nose:  nares patent, mucosa normal, and no drainage Throat/Pharynx:  lips and gingiva without lesion; tongue and uvula midline; non-inflamed pharynx; no exudates or postnasal drainage Neck: neck supple without adenopathy, thyromegaly, or masses Lungs:  clear to auscultation, breath sounds equal bilaterally, no respiratory distress Cardio:  regular rate and rhythm, no LE edema Abdomen:  abdomen soft, nontender; bowel sounds normal; no masses or organomegaly Genital: Defer to GYN Musculoskeletal:  symmetrical muscle groups noted without atrophy or deformity Extremities:  no clubbing, cyanosis, or edema, no deformities, no skin discoloration Neuro:  gait normal; deep tendon reflexes  normal and symmetric Psych: well oriented with normal range of affect and appropriate judgment/insight  Assessment and Plan  Well adult exam - Plan: CBC, Comprehensive metabolic panel with GFR, Lipid panel  Hypothyroidism, unspecified type - Plan: TSH, T4, free   Well 48 y.o. female. Counseled on diet and exercise. Advanced directive form provided today.  Reached out to Dr. Elvan Hamel regarding management recs for her  symptomatic gallstones.  Other orders as above. Follow up in 6 mo. The patient voiced understanding and agreement to the plan.  Shellie Dials [place], DO 01/30/24 7:57 AM

## 2024-01-30 NOTE — Patient Instructions (Signed)
 Give us  2-3 business days to get the results of your labs back.   Keep the diet clean and stay active.  Please get me a copy of your advanced directive form at your convenience.   Let us  know or go to the pharmacy if you change your mind about getting the tetanus booster.   Let us  know if you need anything.

## 2024-01-30 NOTE — Addendum Note (Signed)
 Addended by: Marigene Shoulder on: 01/30/2024 08:09 AM   Modules accepted: Orders

## 2024-01-31 ENCOUNTER — Ambulatory Visit: Payer: Self-pay | Admitting: General Surgery

## 2024-01-31 ENCOUNTER — Other Ambulatory Visit (INDEPENDENT_AMBULATORY_CARE_PROVIDER_SITE_OTHER)

## 2024-01-31 ENCOUNTER — Other Ambulatory Visit: Payer: Self-pay

## 2024-01-31 ENCOUNTER — Ambulatory Visit: Payer: Self-pay | Admitting: Family Medicine

## 2024-01-31 DIAGNOSIS — E039 Hypothyroidism, unspecified: Secondary | ICD-10-CM

## 2024-01-31 DIAGNOSIS — E781 Pure hyperglyceridemia: Secondary | ICD-10-CM

## 2024-01-31 DIAGNOSIS — Z0000 Encounter for general adult medical examination without abnormal findings: Secondary | ICD-10-CM | POA: Diagnosis not present

## 2024-01-31 LAB — COMPREHENSIVE METABOLIC PANEL WITH GFR
ALT: 35 U/L (ref 0–35)
AST: 22 U/L (ref 0–37)
Albumin: 4.1 g/dL (ref 3.5–5.2)
Alkaline Phosphatase: 71 U/L (ref 39–117)
BUN: 16 mg/dL (ref 6–23)
CO2: 24 meq/L (ref 19–32)
Calcium: 9 mg/dL (ref 8.4–10.5)
Chloride: 103 meq/L (ref 96–112)
Creatinine, Ser: 1.11 mg/dL (ref 0.40–1.20)
GFR: 59.38 mL/min — ABNORMAL LOW (ref >60.00)
Glucose, Bld: 86 mg/dL (ref 70–99)
Potassium: 4.2 meq/L (ref 3.5–5.1)
Sodium: 136 meq/L (ref 135–145)
Total Bilirubin: 0.4 mg/dL (ref 0.2–1.2)
Total Protein: 7.1 g/dL (ref 6.0–8.3)

## 2024-01-31 LAB — LIPID PANEL
Cholesterol: 226 mg/dL — ABNORMAL HIGH (ref 0–200)
HDL: 64.7 mg/dL (ref >39.00)
LDL Cholesterol: 121 mg/dL — ABNORMAL HIGH (ref 0–99)
NonHDL: 161.75
Total CHOL/HDL Ratio: 4
Triglycerides: 202 mg/dL — ABNORMAL HIGH (ref 0.0–149.0)
VLDL: 40.4 mg/dL — ABNORMAL HIGH (ref 0.0–40.0)

## 2024-01-31 LAB — TSH: TSH: 5.12 u[IU]/mL (ref 0.35–5.50)

## 2024-01-31 LAB — CBC
HCT: 39.6 % (ref 36.0–46.0)
Hemoglobin: 13.4 g/dL (ref 12.0–15.0)
MCHC: 33.7 g/dL (ref 30.0–36.0)
MCV: 85.8 fl (ref 78.0–100.0)
Platelets: 310 10*3/uL (ref 150.0–400.0)
RBC: 4.62 Mil/uL (ref 3.87–5.11)
RDW: 13.8 % (ref 11.5–15.5)
WBC: 8.7 10*3/uL (ref 4.0–10.5)

## 2024-01-31 LAB — T4, FREE: Free T4: 0.81 ng/dL (ref 0.60–1.60)

## 2024-02-01 NOTE — Patient Instructions (Signed)
 SURGICAL WAITING ROOM VISITATION  Patients having surgery or a procedure may have no more than 2 support people in the waiting area - these visitors may rotate.    Children under the age of 62 must have an adult with them who is not the patient.  Visitors with respiratory illnesses are discouraged from visiting and should remain at home.  If the patient needs to stay at the hospital during part of their recovery, the visitor guidelines for inpatient rooms apply. Pre-op nurse will coordinate an appropriate time for 1 support person to accompany patient in pre-op.  This support person may not rotate.    Please refer to the [facility] website for the visitor guidelines for Inpatients (after your surgery is over and you are in a regular room).    Your procedure is scheduled on: 02/03/24   Report to [facility] Main Entrance    Report to admitting at 8:45 PM   Call this number if you have problems the morning of surgery [phone]   Do not eat food :After Midnight.   After Midnight you may have the following liquids until 8:00 AM DAY OF SURGERY  Water Non-Citrus Juices (without pulp, NO RED-Apple, White grape, White cranberry) Black Coffee (NO MILK/CREAM OR CREAMERS, sugar ok)  Clear Tea (NO MILK/CREAM OR CREAMERS, sugar ok) regular and decaf                             Plain Jell-O (NO RED)                                           Fruit ices (not with fruit pulp, NO RED)                                     Popsicles (NO RED)                                                               Sports drinks like Gatorade (NO RED)          If you have questions, please contact your surgeon's office.   FOLLOW BOWEL PREP AND ANY ADDITIONAL PRE OP INSTRUCTIONS YOU RECEIVED FROM YOUR SURGEON'S OFFICE!!!     Oral Hygiene is also important to reduce your risk of infection.                                    Remember - BRUSH YOUR TEETH THE MORNING OF SURGERY WITH YOUR REGULAR  TOOTHPASTE  DENTURES WILL BE REMOVED PRIOR TO SURGERY PLEASE DO NOT APPLY "Poly grip" OR ADHESIVES!!!   Stop all vitamins and herbal supplements 7 days before surgery.   Take these medicines the morning of surgery with A SIP OF WATER: Levothyroxine                                You may not have any metal on your  body including hair pins, jewelry, and body piercing             Do not wear make-up, lotions, powders, perfumes, or deodorant  Do not wear nail polish including gel and S&S, artificial/acrylic nails, or any other type of covering on natural nails including finger and toenails. If you have artificial nails, gel coating, etc. that needs to be removed by a nail salon please have this removed prior to surgery or surgery may need to be canceled/ delayed if the surgeon/ anesthesia feels like they are unable to be safely monitored.   Do not shave  48 hours prior to surgery.    Do not bring valuables to the hospital. [facility] IS NOT             RESPONSIBLE   FOR VALUABLES.   Contacts, glasses, dentures or bridgework may not be worn into surgery.  DO NOT BRING YOUR HOME MEDICATIONS TO THE HOSPITAL. PHARMACY WILL DISPENSE MEDICATIONS LISTED ON YOUR MEDICATION LIST TO YOU DURING YOUR ADMISSION IN THE HOSPITAL!    Patients discharged on the day of surgery will not be allowed to drive home.  Someone NEEDS to stay with you for the first 24 hours after anesthesia.              Please read over the following fact sheets you were given: IF YOU HAVE QUESTIONS ABOUT YOUR PRE-OP INSTRUCTIONS PLEASE CALL [name]    If you received a COVID test during your pre-op visit  it is requested that you wear a mask when out in public, stay away from anyone that may not be feeling well and notify your surgeon if you develop symptoms. If you test positive for Covid or have been in contact with anyone that has tested positive in the last 10 days please notify you surgeon.    [facility] -  Preparing for Surgery Before surgery, you can play an important role.  Because skin is not sterile, your skin needs to be as free of germs as possible.  You can reduce the number of germs on your skin by washing with CHG (chlorahexidine gluconate) soap before surgery.  CHG is an antiseptic cleaner which kills germs and bonds with the skin to continue killing germs even after washing. Please DO NOT use if you have an allergy to CHG or antibacterial soaps.  If your skin becomes reddened/irritated stop using the CHG and inform your nurse when you arrive at Short Stay. Do not shave (including legs and underarms) for at least 48 hours prior to the first CHG shower.  You may shave your face/neck.  Please follow these instructions carefully:  1.  Shower with CHG Soap the night before surgery and the  morning of surgery.  2.  If you choose to wash your hair, wash your hair first as usual with your normal  shampoo.  3.  After you shampoo, rinse your hair and body thoroughly to remove the shampoo.                             4.  Use CHG as you would any other liquid soap.  You can apply chg directly to the skin and wash.  Gently with a scrungie or clean washcloth.  5.  Apply the CHG Soap to your body ONLY FROM THE NECK DOWN.   Do   not use on face/ open  Wound or open sores. Avoid contact with eyes, ears mouth and   genitals (private parts).                       Wash face,  Genitals (private parts) with your normal soap.             6.  Wash thoroughly, paying special attention to the area where your    surgery  will be performed.  7.  Thoroughly rinse your body with warm water from the neck down.  8.  DO NOT shower/wash with your normal soap after using and rinsing off the CHG Soap.                9.  Pat yourself dry with a clean towel.            10.  Wear clean pajamas.            11.  Place clean sheets on your bed the night of your first shower and do not  sleep with  pets. Day of Surgery : Do not apply any lotions/deodorants the morning of surgery.  Please wear clean clothes to the hospital/surgery center.  FAILURE TO FOLLOW THESE INSTRUCTIONS MAY RESULT IN THE CANCELLATION OF YOUR SURGERY  PATIENT SIGNATURE_________________________________  NURSE SIGNATURE__________________________________  ________________________________________________________________________

## 2024-02-01 NOTE — Progress Notes (Signed)
 COVID Vaccine Completed: yes  Date of COVID positive in last 90 days:  PCP - Dawna Etienne, DO Cardiologist -   Chest x-ray -  EKG -  Stress Test -  ECHO -  Cardiac Cath -  Pacemaker/ICD device last checked: Spinal Cord Stimulator:  Bowel Prep -   Sleep Study -  CPAP -   Fasting Blood Sugar -  Checks Blood Sugar _____ times a day  Last dose of GLP1 agonist-  N/A GLP1 instructions:  Hold 7 days before surgery    Last dose of SGLT-2 inhibitors-  N/A SGLT-2 instructions:  Hold 3 days before surgery    Blood Thinner Instructions:  Last dose:   Time: Aspirin Instructions: Last Dose:  Activity level:  Can go up a flight of stairs and perform activities of daily living without stopping and without symptoms of chest pain or shortness of breath.  Able to exercise without symptoms  Unable to go up a flight of stairs without symptoms of     Anesthesia review:   Patient denies shortness of breath, fever, cough and chest pain at PAT appointment  Patient verbalized understanding of instructions that were given to them at the PAT appointment. Patient was also instructed that they will need to review over the PAT instructions again at home before surgery.

## 2024-02-02 ENCOUNTER — Other Ambulatory Visit: Payer: Self-pay

## 2024-02-02 ENCOUNTER — Encounter (HOSPITAL_COMMUNITY)
Admission: RE | Admit: 2024-02-02 | Discharge: 2024-02-02 | Disposition: A | Discharge status: Home / Self Care | Source: Ambulatory Visit | Attending: General Surgery | Admitting: General Surgery

## 2024-02-02 ENCOUNTER — Encounter (HOSPITAL_COMMUNITY): Payer: Self-pay

## 2024-02-02 DIAGNOSIS — K8020 Calculus of gallbladder without cholecystitis without obstruction: Secondary | ICD-10-CM | POA: Diagnosis present

## 2024-02-02 DIAGNOSIS — E039 Hypothyroidism, unspecified: Secondary | ICD-10-CM | POA: Diagnosis not present

## 2024-02-02 DIAGNOSIS — Z87891 Personal history of nicotine dependence: Secondary | ICD-10-CM | POA: Diagnosis not present

## 2024-02-02 DIAGNOSIS — Z79890 Hormone replacement therapy (postmenopausal): Secondary | ICD-10-CM | POA: Diagnosis not present

## 2024-02-02 DIAGNOSIS — K589 Irritable bowel syndrome without diarrhea: Secondary | ICD-10-CM | POA: Diagnosis not present

## 2024-02-02 DIAGNOSIS — Z01818 Encounter for other preprocedural examination: Secondary | ICD-10-CM | POA: Insufficient documentation

## 2024-02-02 DIAGNOSIS — K8010 Calculus of gallbladder with chronic cholecystitis without obstruction: Secondary | ICD-10-CM | POA: Diagnosis not present

## 2024-02-02 HISTORY — DX: Hematuria, unspecified: R31.9

## 2024-02-03 ENCOUNTER — Other Ambulatory Visit: Payer: Self-pay

## 2024-02-03 ENCOUNTER — Ambulatory Visit (HOSPITAL_COMMUNITY): Payer: Self-pay | Admitting: Vascular Surgery

## 2024-02-03 ENCOUNTER — Encounter (HOSPITAL_COMMUNITY)
Admission: RE | Disposition: A | Discharge status: Home / Self Care | Payer: Self-pay | Source: Home / Self Care | Attending: General Surgery

## 2024-02-03 ENCOUNTER — Encounter (HOSPITAL_COMMUNITY): Payer: Self-pay | Admitting: General Surgery

## 2024-02-03 ENCOUNTER — Ambulatory Visit (HOSPITAL_COMMUNITY)
Admission: RE | Admit: 2024-02-03 | Discharge: 2024-02-03 | Disposition: A | Discharge status: Home / Self Care | Attending: General Surgery | Admitting: General Surgery

## 2024-02-03 DIAGNOSIS — Z87891 Personal history of nicotine dependence: Secondary | ICD-10-CM | POA: Insufficient documentation

## 2024-02-03 DIAGNOSIS — K8010 Calculus of gallbladder with chronic cholecystitis without obstruction: Secondary | ICD-10-CM | POA: Insufficient documentation

## 2024-02-03 DIAGNOSIS — Z79890 Hormone replacement therapy (postmenopausal): Secondary | ICD-10-CM | POA: Insufficient documentation

## 2024-02-03 DIAGNOSIS — K589 Irritable bowel syndrome without diarrhea: Secondary | ICD-10-CM | POA: Insufficient documentation

## 2024-02-03 DIAGNOSIS — E039 Hypothyroidism, unspecified: Secondary | ICD-10-CM | POA: Insufficient documentation

## 2024-02-03 HISTORY — PX: CHOLECYSTECTOMY: SHX55

## 2024-02-03 LAB — POCT PREGNANCY, URINE: Preg Test, Ur: NEGATIVE

## 2024-02-03 SURGERY — LAPAROSCOPIC CHOLECYSTECTOMY
Anesthesia: General

## 2024-02-03 MED ORDER — ROCURONIUM BROMIDE 10 MG/ML (PF) SYRINGE
PREFILLED_SYRINGE | INTRAVENOUS | Status: DC | PRN
  Administered 2024-02-03: 50 mg via INTRAVENOUS

## 2024-02-03 MED ORDER — KETOROLAC TROMETHAMINE 30 MG/ML IJ SOLN
INTRAMUSCULAR | Status: DC | PRN
  Administered 2024-02-03: 30 mg via INTRAVENOUS

## 2024-02-03 MED ORDER — ONDANSETRON HCL 4 MG/2ML IJ SOLN
INTRAMUSCULAR | Status: DC | PRN
  Administered 2024-02-03: 4 mg via INTRAVENOUS

## 2024-02-03 MED ORDER — 0.9 % SODIUM CHLORIDE (POUR BTL) OPTIME
TOPICAL | Status: DC | PRN
  Administered 2024-02-03: 1000 mL

## 2024-02-03 MED ORDER — CHLORHEXIDINE GLUCONATE CLOTH 2 % EX PADS: 6.0000 | MEDICATED_PAD | Freq: Once | CUTANEOUS | Status: DC

## 2024-02-03 MED ORDER — PHENYLEPHRINE 80 MCG/ML (10ML) SYRINGE FOR IV PUSH (FOR BLOOD PRESSURE SUPPORT)
PREFILLED_SYRINGE | INTRAVENOUS | Status: AC
  Filled 2024-02-03: qty 10

## 2024-02-03 MED ORDER — KETOROLAC TROMETHAMINE 30 MG/ML IJ SOLN
INTRAMUSCULAR | Status: AC
  Filled 2024-02-03: qty 1

## 2024-02-03 MED ORDER — HYDROMORPHONE HCL 1 MG/ML IJ SOLN
INTRAMUSCULAR | Status: AC
  Filled 2024-02-03: qty 1

## 2024-02-03 MED ORDER — ORAL CARE MOUTH RINSE: 15.0000 mL | Freq: Once | OROMUCOSAL | Status: AC

## 2024-02-03 MED ORDER — FENTANYL CITRATE (PF) 100 MCG/2ML IJ SOLN
INTRAMUSCULAR | Status: AC
  Filled 2024-02-03: qty 2

## 2024-02-03 MED ORDER — ONDANSETRON HCL 4 MG/2ML IJ SOLN
INTRAMUSCULAR | Status: AC
  Filled 2024-02-03: qty 2

## 2024-02-03 MED ORDER — ACETAMINOPHEN 500 MG PO TABS
1000.0000 mg | ORAL_TABLET | Freq: Once | ORAL | Status: AC
  Administered 2024-02-03: 1000 mg via ORAL
  Filled 2024-02-03: qty 2

## 2024-02-03 MED ORDER — DEXAMETHASONE SODIUM PHOSPHATE 10 MG/ML IJ SOLN
INTRAMUSCULAR | Status: AC
  Filled 2024-02-03: qty 1

## 2024-02-03 MED ORDER — CHLORHEXIDINE GLUCONATE 0.12 % MT SOLN
15.0000 mL | Freq: Once | OROMUCOSAL | Status: AC
  Administered 2024-02-03: 15 mL via OROMUCOSAL

## 2024-02-03 MED ORDER — CEFAZOLIN SODIUM-DEXTROSE 2-4 GM/100ML-% IV SOLN
2.0000 g | INTRAVENOUS | Status: AC
  Administered 2024-02-03: 2 g via INTRAVENOUS
  Filled 2024-02-03: qty 100

## 2024-02-03 MED ORDER — BUPIVACAINE-EPINEPHRINE (PF) 0.25% -1:200000 IJ SOLN
INTRAMUSCULAR | Status: AC
  Filled 2024-02-03: qty 30

## 2024-02-03 MED ORDER — ACETAMINOPHEN 500 MG PO TABS: 1000.0000 mg | ORAL_TABLET | Freq: Three times a day (TID) | ORAL | Status: AC

## 2024-02-03 MED ORDER — DROPERIDOL 2.5 MG/ML IJ SOLN: 0.6250 mg | Freq: Once | INTRAMUSCULAR | Status: DC | PRN

## 2024-02-03 MED ORDER — PHENYLEPHRINE 80 MCG/ML (10ML) SYRINGE FOR IV PUSH (FOR BLOOD PRESSURE SUPPORT)
PREFILLED_SYRINGE | INTRAVENOUS | Status: DC | PRN
Start: 2024-02-03 — End: 2024-02-03
  Administered 2024-02-03 (×2): 160 ug via INTRAVENOUS
  Administered 2024-02-03 (×2): 240 ug via INTRAVENOUS

## 2024-02-03 MED ORDER — PROPOFOL 10 MG/ML IV BOLUS
INTRAVENOUS | Status: DC | PRN
  Administered 2024-02-03: 140 mg via INTRAVENOUS

## 2024-02-03 MED ORDER — MIDAZOLAM HCL 5 MG/5ML IJ SOLN
INTRAMUSCULAR | Status: DC | PRN
  Administered 2024-02-03: 2 mg via INTRAVENOUS

## 2024-02-03 MED ORDER — SPY AGENT GREEN - (INDOCYANINE FOR INJECTION)
1.2500 mg | Freq: Once | INTRAMUSCULAR | Status: AC
Start: 2024-02-03 — End: 2024-02-03
  Administered 2024-02-03: 1.25 mg via INTRAVENOUS
  Filled 2024-02-03: qty 10

## 2024-02-03 MED ORDER — BUPIVACAINE-EPINEPHRINE 0.25% -1:200000 IJ SOLN
INTRAMUSCULAR | Status: DC | PRN
  Administered 2024-02-03: 30 mL

## 2024-02-03 MED ORDER — LIDOCAINE HCL (PF) 2 % IJ SOLN
INTRAMUSCULAR | Status: DC | PRN
  Administered 2024-02-03: 100 mg via INTRADERMAL

## 2024-02-03 MED ORDER — CELECOXIB 200 MG PO CAPS
200.0000 mg | ORAL_CAPSULE | Freq: Once | ORAL | Status: AC
  Administered 2024-02-03: 200 mg via ORAL
  Filled 2024-02-03: qty 1

## 2024-02-03 MED ORDER — HYDROMORPHONE HCL 1 MG/ML IJ SOLN
0.2500 mg | INTRAMUSCULAR | Status: DC | PRN
  Administered 2024-02-03: 0.5 mg via INTRAVENOUS

## 2024-02-03 MED ORDER — LIDOCAINE HCL (PF) 2 % IJ SOLN
INTRAMUSCULAR | Status: AC
  Filled 2024-02-03: qty 5

## 2024-02-03 MED ORDER — STERILE WATER FOR IRRIGATION IR SOLN
Status: DC | PRN
  Administered 2024-02-03: 1000 mL

## 2024-02-03 MED ORDER — FENTANYL CITRATE (PF) 100 MCG/2ML IJ SOLN
INTRAMUSCULAR | Status: DC | PRN
  Administered 2024-02-03: 50 ug via INTRAVENOUS
  Administered 2024-02-03: 100 ug via INTRAVENOUS

## 2024-02-03 MED ORDER — LACTATED RINGERS IV SOLN: INTRAVENOUS | Status: DC

## 2024-02-03 MED ORDER — SUGAMMADEX SODIUM 200 MG/2ML IV SOLN
INTRAVENOUS | Status: DC | PRN
  Administered 2024-02-03: 200 mg via INTRAVENOUS

## 2024-02-03 MED ORDER — ACETAMINOPHEN 500 MG PO TABS: 1000.0000 mg | ORAL_TABLET | ORAL | Status: AC

## 2024-02-03 MED ORDER — DEXAMETHASONE SODIUM PHOSPHATE 10 MG/ML IJ SOLN
INTRAMUSCULAR | Status: DC | PRN
  Administered 2024-02-03: 4 mg via INTRAVENOUS

## 2024-02-03 MED ORDER — PROPOFOL 10 MG/ML IV BOLUS
INTRAVENOUS | Status: AC
  Filled 2024-02-03: qty 20

## 2024-02-03 MED ORDER — OXYCODONE HCL 5 MG PO TABS: 5.0000 mg | ORAL_TABLET | Freq: Four times a day (QID) | ORAL | 0 refills | Status: DC | PRN

## 2024-02-03 MED ORDER — ROCURONIUM BROMIDE 10 MG/ML (PF) SYRINGE
PREFILLED_SYRINGE | INTRAVENOUS | Status: AC
  Filled 2024-02-03: qty 10

## 2024-02-03 MED ORDER — MIDAZOLAM HCL 2 MG/2ML IJ SOLN
INTRAMUSCULAR | Status: AC
  Filled 2024-02-03: qty 2

## 2024-02-03 SURGICAL SUPPLY — 42 items
APPLICATOR ARISTA FLEXITIP XL (MISCELLANEOUS) IMPLANT
BAG COUNTER SPONGE SURGICOUNT (BAG) IMPLANT
CABLE HIGH FREQUENCY MONO STRZ (ELECTRODE) ×1 IMPLANT
CHLORAPREP W/TINT 26 (MISCELLANEOUS) ×1 IMPLANT
CLIP APPLIE 5 13 M/L LIGAMAX5 (MISCELLANEOUS) IMPLANT
CLIP APPLIE ROT 10 11.4 M/L (STAPLE) IMPLANT
CLIP LIGATING HEMO O LOK GREEN (MISCELLANEOUS) IMPLANT
COVER MAYO STAND XLG (MISCELLANEOUS) IMPLANT
COVER SURGICAL LIGHT HANDLE (MISCELLANEOUS) ×1 IMPLANT
DERMABOND ADVANCED .7 DNX12 (GAUZE/BANDAGES/DRESSINGS) IMPLANT
DRAPE C-ARM 42X120 X-RAY (DRAPES) IMPLANT
DRSG TEGADERM 2-3/8X2-3/4 SM (GAUZE/BANDAGES/DRESSINGS) ×3 IMPLANT
DRSG TEGADERM 4X4.75 (GAUZE/BANDAGES/DRESSINGS) ×1 IMPLANT
ELECT REM PT RETURN 15FT ADLT (MISCELLANEOUS) ×1 IMPLANT
GAUZE SPONGE 2X2 8PLY STRL LF (GAUZE/BANDAGES/DRESSINGS) ×1 IMPLANT
GLOVE BIO SURGEON STRL SZ7.5 (GLOVE) ×1 IMPLANT
GLOVE INDICATOR 8.0 STRL GRN (GLOVE) ×1 IMPLANT
GOWN STRL REUS W/ TWL XL LVL3 (GOWN DISPOSABLE) ×1 IMPLANT
GRASPER SUT TROCAR 14GX15 (MISCELLANEOUS) IMPLANT
HEMOSTAT ARISTA ABSORB 3G PWDR (HEMOSTASIS) IMPLANT
HEMOSTAT SNOW SURGICEL 2X4 (HEMOSTASIS) IMPLANT
IRRIGATION SUCT STRKRFLW 2 WTP (MISCELLANEOUS) ×1 IMPLANT
KIT BASIN OR (CUSTOM PROCEDURE TRAY) ×1 IMPLANT
KIT TURNOVER KIT A (KITS) IMPLANT
LHOOK LAP DISP 36CM (ELECTROSURGICAL) IMPLANT
POUCH RETRIEVAL ECOSAC 10 (ENDOMECHANICALS) ×1 IMPLANT
SCISSORS LAP 5X35 DISP (ENDOMECHANICALS) ×1 IMPLANT
SET CHOLANGIOGRAPH MIX (MISCELLANEOUS) IMPLANT
SET TUBE SMOKE EVAC HIGH FLOW (TUBING) ×1 IMPLANT
SLEEVE ADV FIXATION 5X100MM (TROCAR) ×1 IMPLANT
SPIKE FLUID TRANSFER (MISCELLANEOUS) ×1 IMPLANT
STRIP CLOSURE SKIN 1/2X4 (GAUZE/BANDAGES/DRESSINGS) ×1 IMPLANT
SUT MNCRL AB 4-0 PS2 18 (SUTURE) ×1 IMPLANT
SUT VIC AB 0 UR5 27 (SUTURE) IMPLANT
SUT VICRYL 0 TIES 12 18 (SUTURE) IMPLANT
SUT VICRYL 0 UR6 27IN ABS (SUTURE) IMPLANT
TOWEL OR 17X26 10 PK STRL BLUE (TOWEL DISPOSABLE) ×1 IMPLANT
TRAY LAPAROSCOPIC (CUSTOM PROCEDURE TRAY) ×1 IMPLANT
TROCAR ADV FIXATION 12X100MM (TROCAR) IMPLANT
TROCAR ADV FIXATION 5X100MM (TROCAR) ×1 IMPLANT
TROCAR BALLN 12MMX100 BLUNT (TROCAR) IMPLANT
TROCAR XCEL NON-BLD 5MMX100MML (ENDOMECHANICALS) IMPLANT

## 2024-02-03 NOTE — Anesthesia Preprocedure Evaluation (Signed)
 Anesthesia Evaluation  Patient identified by MRN, date of birth, ID band Patient awake    Reviewed: Allergy & Precautions, NPO status , Patient's Chart, lab work & pertinent test results  Airway Mallampati: II  TM Distance: >3 FB Neck ROM: Full    Dental no notable dental hx.    Pulmonary neg pulmonary ROS, former smoker   Pulmonary exam normal        Cardiovascular negative cardio ROS  Rhythm:Regular Rate:Normal     Neuro/Psych   Anxiety Depression    negative neurological ROS     GI/Hepatic Neg liver ROS,,,Cholecystitis    Endo/Other  Hypothyroidism    Renal/GU negative Renal ROS  negative genitourinary   Musculoskeletal  (+) Arthritis , Rheumatoid disorders,    Abdominal Normal abdominal exam  (+)   Peds  Hematology Lab Results      Component                Value               Date                      WBC                      8.7                 01/31/2024                HGB                      13.4                01/31/2024                HCT                      39.6                01/31/2024                MCV                      85.8                01/31/2024                PLT                      310.0               01/31/2024              Anesthesia Other Findings   Reproductive/Obstetrics                             Anesthesia Physical Anesthesia Plan  ASA: 2  Anesthesia Plan: General   Post-op Pain Management: Tylenol PO (pre-op)* and Celebrex PO (pre-op)*   Induction: Intravenous  PONV Risk Score and Plan: 3 and Ondansetron, Dexamethasone, Midazolam and Treatment may vary due to age or medical condition  Airway Management Planned: Mask and Oral ETT  Additional Equipment: None  Intra-op Plan:   Post-operative Plan: Extubation in OR  Informed Consent: I have reviewed the patients History and Physical, chart, labs and discussed the procedure including the  risks, benefits and alternatives for the  proposed anesthesia with the patient or authorized representative who has indicated his/her understanding and acceptance.     Dental advisory given  Plan Discussed with: CRNA  Anesthesia Plan Comments:        Anesthesia Quick Evaluation

## 2024-02-03 NOTE — Transfer of Care (Signed)
 Immediate Anesthesia Transfer of Care Note  Patient: Marcia Rodgers  Procedure(s) Performed: LAPAROSCOPIC CHOLECYSTECTOMY  Patient Location: PACU  Anesthesia Type:General  Level of Consciousness: sedated  Airway & Oxygen Therapy: Patient Spontanous Breathing and Patient connected to face mask oxygen  Post-op Assessment: Report given to RN and Post -op Vital signs reviewed and stable  Post vital signs: Reviewed and stable  Last Vitals:  Vitals Value Taken Time  BP 127/67 02/03/24 1247  Temp    Pulse 75 02/03/24 1248  Resp 19 02/03/24 1248  SpO2 100 % 02/03/24 1248  Vitals shown include unfiled device data.  Last Pain:  Vitals:   02/03/24 0939  TempSrc:   PainSc: 0-No pain      Patients Stated Pain Goal: 5 (02/03/24 0931)  Complications: No notable events documented.

## 2024-02-03 NOTE — Discharge Instructions (Signed)

## 2024-02-03 NOTE — Anesthesia Procedure Notes (Signed)
 Procedure Name: Intubation Date/Time: 02/03/2024 11:32 AM  Performed by: Micky Albee, CRNAPre-anesthesia Checklist: Patient identified, Emergency Drugs available, Suction available, Patient being monitored and Timeout performed Patient Re-evaluated:Patient Re-evaluated prior to induction Oxygen Delivery Method: Circle system utilized Preoxygenation: Pre-oxygenation with 100% oxygen Induction Type: IV induction Ventilation: Mask ventilation without difficulty Laryngoscope Size: Mac and 4 Grade View: Grade I Tube type: Oral Tube size: 7.0 mm Number of attempts: 1 Airway Equipment and Method: Stylet Placement Confirmation: ETT inserted through vocal cords under direct vision, positive ETCO2 and breath sounds checked- equal and bilateral Secured at: 22 cm Tube secured with: Tape Dental Injury: Teeth and Oropharynx as per pre-operative assessment

## 2024-02-03 NOTE — Interval H&P Note (Signed)
 History and Physical Interval Note:  02/03/2024 11:02 AM  Marcia Rodgers  has presented today for surgery, with the diagnosis of SYMPTOMATIC CHOLELITHIASIS.  The various methods of treatment have been discussed with the patient and family. After consideration of risks, benefits and other options for treatment, the patient has consented to  Procedure(s) with comments: LAPAROSCOPIC CHOLECYSTECTOMY (N/A) - ICG DYE/CAMERA as a surgical intervention.  The patient's history has been reviewed, patient examined, no change in status, stable for surgery.  I have reviewed the patient's chart and labs.  Questions were answered to the patient's satisfaction.     Aldean Hummingbird

## 2024-02-03 NOTE — Anesthesia Postprocedure Evaluation (Signed)
 Anesthesia Post Note  Patient: Marcia Rodgers  Procedure(s) Performed: LAPAROSCOPIC CHOLECYSTECTOMY     Patient location during evaluation: PACU Anesthesia Type: General Level of consciousness: awake and alert Pain management: pain level controlled Vital Signs Assessment: post-procedure vital signs reviewed and stable Respiratory status: spontaneous breathing, nonlabored ventilation, respiratory function stable and patient connected to nasal cannula oxygen Cardiovascular status: blood pressure returned to baseline and stable Postop Assessment: no apparent nausea or vomiting Anesthetic complications: no   No notable events documented.  Last Vitals:  Vitals:   02/03/24 1330 02/03/24 1345  BP: 110/67 118/73  Pulse: 63 (!) 57  Resp: 15 15  Temp:  (!) 36.4 C  SpO2: 97% 98%    Last Pain:  Vitals:   02/03/24 1345  TempSrc:   PainSc: 3                  Shonique Pelphrey P Velicia Dejager

## 2024-02-03 NOTE — Op Note (Signed)
 HALLEL DENHERDER [id number] 1976/08/24 02/03/2024  Laparoscopic Cholecystectomy with near infrared fluorescent cholangiography procedure Note  Indications: This patient presents with symptomatic gallbladder disease and will undergo laparoscopic cholecystectomy.  Pre-operative Diagnosis: symptomatic cholelithiasis  Post-operative Diagnosis: same  Surgeon: Aldean Hummingbird MD FACS  Assistants: none  Anesthesia: General endotracheal anesthesia  Procedure Details  The patient was seen again in the Holding Room. The risks, benefits, complications, treatment options, and expected outcomes were discussed with the patient. The possibilities of reaction to medication, pulmonary aspiration, perforation of viscus, bleeding, recurrent infection, finding a normal gallbladder, the need for additional procedures, failure to diagnose a condition, the possible need to convert to an open procedure, and creating a complication requiring transfusion or operation were discussed with the patient. The likelihood of improving the patient's symptoms with return to their baseline status is good.  The patient and/or family concurred with the proposed plan, giving informed consent. The site of surgery properly noted. The patient was taken to Operating Room, identified as MELANE WINDHOLZ and the procedure verified as Laparoscopic Cholecystectomy with ICG dye.  A Time Out was held and the above information confirmed. Antibiotic prophylaxis was administered.    ICG dye was administered preoperatively.    General endotracheal anesthesia was then administered and tolerated well. After the induction, the abdomen was prepped with Chloraprep and draped in the sterile fashion. The patient was positioned in the supine position.  Access to the abdomen was obtained via the Optiview technique since the patient had had a prior abdominoplasty..  A small incision was made to the left of the midline just below the subcostal margin.  Then using a 0  degree 5 mm laparoscope through a 5 mm trocar the laparoscope was advanced through all layers of the abdominal wall and carefully entered the abdominal cavity.  Pneumoperitoneum was smoothly established up to a patient pressure of 15 mmHg without any change in patient vital signs.  The laparoscope was advanced in the abdominal cavity was surveilled.  There is no evidence of injury to surrounding structures. We positioned the patient in reverse Trendelenburg, tilted slightly to the patient's left.  A 5 mm port was placed in the umbilical position under direct visualization.  The optical entry trocar was exchanged for a 12 mm trocar.  Two 5-mm ports were placed in the right upper quadrant. All skin incisions were infiltrated with a local anesthetic agent before making the incision and placing the trocars.     The gallbladder was identified, the fundus grasped and retracted cephalad. Adhesions were lysed bluntly and with the electrocautery where indicated, taking care not to injure any adjacent organs or viscus. The infundibulum was grasped and retracted laterally, exposing the peritoneum overlying the triangle of Calot. This was then divided and exposed in a blunt fashion. A critical view of the cystic duct and cystic artery was obtained.  The cystic duct was clearly identified and bluntly dissected circumferentially.  Utilizing the Stryker camera system near infrared fluorescent cholangiography activity was visualized in the liver, cystic duct, common hepatic duct and common bile duct and small bowel.  This served as a secondary confirmation of our anatomy.  The cystic duct was then ligated with clips and divided. The cystic artery which had been identified & dissected free was ligated with clips and divided as well.   The gallbladder was dissected from the liver bed in retrograde fashion with the electrocautery. The gallbladder was removed and placed in an Ecco sac.   The liver  bed was irrigated and  inspected. Hemostasis was achieved with the electrocautery. Copious irrigation was utilized and was repeatedly aspirated until clear. The gallbladder and Ecco sac were then removed through the left upper quadrant port site.  I did have to enlarge the fascial defect slightly in order to get the gallbladder extracted  We again inspected the right upper quadrant for hemostasis.  The left upper quadrant port site was closed with three 0 Vicryl using a PMI suture passer with laparoscopic guidance.  The left upper quadrant closure was inspected and there was no air leak and nothing trapped within the closure. Pneumoperitoneum was released as we removed the trocars.  4-0 Monocryl was used to close the skin.   Dermabond was applied. The patient was then extubated and brought to the recovery room in stable condition. Instrument, sponge, and needle counts were correct at closure and at the conclusion of the case.   Findings: Cholelithiasis Positive critical view Near infrared fluorescent cholangiography visualized within the liver, common hepatic duct, common bile duct, cystic duct and small bowel  Estimated Blood Loss: Minimal         Drains: none         Specimens: Gallbladder           Complications: None; patient tolerated the procedure well.         Disposition: PACU - hemodynamically stable.         Condition: stable  Marianna Shirk. Elvan Hamel, MD, FACS General, Bariatric, & Minimally Invasive Surgery [facility] Surgery,  [facility]

## 2024-02-03 NOTE — H&P (Signed)
 REFERRING PHYSICIAN: Jobe Mulder*  PROVIDER: Haivyn Oravec Veldon German, MD  MRN: [id number] DOB: 09-Nov-1975 DATE OF ENCOUNTER: 01/17/2024  Subjective   Chief Complaint: New Consultation   History of Present Illness: Marcia Rodgers is a 48 y.o. female who is seen today as an office consultation at the request of Dr. Gwenette Lennox for evaluation of New Consultation .  History of Present Illness Marcia Rodgers is a 48 year old female who presents with abdominal spasms and pain. She was referred by her primary care doctor for evaluation of abdominal pain.  She experiences severe abdominal spasms, with two episodes noted. The first episode lasted about 5-10 minutes, while the second was more intense. The pain is described as a 'band' around the upper abdomen, more pronounced on the right side, and severe enough to cause difficulty breathing. The spasms are not associated with eating, as they occurred without recent food intake. During the second episode, she took a gas pill and ibuprofen, but neither provided relief.  She has a history of skin removal surgery in 2015, which involved the placement of mesh in her abdomen. She is unsure of the size or specific location of the mesh but recalls it was performed by a Engineer, petroleum in [place], [place] .  She takes medication for her thyroid  and does not smoke. She reports changes in her bowel habits, with more frequent episodes resembling IBS since the onset of her abdominal symptoms. She avoids gluten and dairy due to dietary sensitivities and has experienced weight loss, which she attributes to dietary changes. She exercises regularly to manage joint stiffness.  No heartburn, but she reports a burning sensation in the abdominal area and occasionally tastes bile when burping.    Review of Systems: A complete review of systems was obtained from the patient. I have reviewed this information and discussed as appropriate with the patient. See HPI as  well for other ROS.  ROS  Medical History: Past Medical History: Diagnosis Date Anxiety Arthritis Thyroid  disease  There is no problem list on file for this patient.  Past Surgical History: Procedure Laterality Date BRACHIOPLASTY   No Known Allergies  Current Outpatient Medications on File Prior to Visit Medication Sig Dispense Refill buPROPion  (WELLBUTRIN  XL) 300 MG XL tablet Take 1 tablet by mouth once daily drospirenone-ethinyl estradioL ([name], 28,) 3-0.02 mg tablet Take 1 tablet by mouth once daily esterified estrogens (MENEST) 2.5 mg tablet Take 2.5 mg by mouth once daily levothyroxine  (SYNTHROID ) 100 MCG tablet Take 100 mcg by mouth every morning before breakfast (0630) multivitamin tablet Take 1 tablet by mouth once daily  No current facility-administered medications on file prior to visit.  Family History Problem Relation Age of Onset Skin cancer Mother   Social History  Tobacco Use Smoking Status Former Types: Cigarettes Start date: 2008 Smokeless Tobacco Never   Social History  Socioeconomic History Marital status: Married Tobacco Use Smoking status: Former Types: Cigarettes Start date: 2008 Smokeless tobacco: Never Substance and Sexual Activity Alcohol use: Never Drug use: Never  Social Drivers of Catering manager Strain: Low Risk (12/27/2023) Received from [facility] Overall Financial Resource Strain (CARDIA) Difficulty of Paying Living Expenses: Not very hard Food Insecurity: No Food Insecurity (12/27/2023) Received from [facility] Hunger Vital Sign Worried About Running Out of Food in the Last Year: Never true Ran Out of Food in the Last Year: Never true Transportation Needs: No Transportation Needs (12/27/2023) Received from [facility] - Transportation Lack of Transportation (Medical): No Lack of Transportation (  Non-Medical): No Physical Activity: Sufficiently Active (12/27/2023) Received from [facility] Exercise Vital Sign Days of Exercise per Week: 5 days Minutes of Exercise per Session: 40 min Stress: Stress Concern Present (12/27/2023) Received from [facility] - Occupational Stress Questionnaire Feeling of Stress : To some extent Social Connections: Unknown (12/27/2023) Received from [facility] Social Connection and Isolation Panel [NHANES] Frequency of Communication with Friends and Family: More than three times a week Frequency of Social Gatherings with Friends and Family: Patient declined Attends Religious Services: Patient declined Database administrator or Organizations: No Marital Status: Married Housing Stability: Unknown (01/17/2024) Housing Stability Vital Sign Homeless in the Last Year: No  Objective:  Vitals: 01/17/24 1500 BP: 120/80 Pulse: 86 Temp: 36.5 C (97.7 F) SpO2: 98% Weight: 79.2 kg (174 lb 9.6 oz) Height: 162.6 cm (5\' 4" ) PainSc: 2 PainLoc: Abdomen  Body mass index is 29.97 kg/m.  Constitutional: NAD; conversant; no deformities Eyes: Moist conjunctiva; no lid lag; anicteric; PERRL Neck: Trachea midline; no thyromegaly Lungs: Normal respiratory effort; no tactile fremitus CV: RRR; no palpable thrills; no pitting edema GI: Abd soft, nontender, nondistended, old abdominoplasty incisions; no palpable hepatosplenomegaly MSK: Normal gait; no clubbing/cyanosis Psychiatric: Appropriate affect; alert and oriented x3 Lymphatic: No palpable cervical or axillary lymphadenopathy Skin: No rash, lesions or jaundice  Labs, Imaging and Diagnostic Testing: Pcp note 12/27/23 01/05/2024 ULTRASOUND ABDOMEN LIMITED RIGHT UPPER QUADRANT  COMPARISON: None Available.  FINDINGS: Gallbladder:  Multiple gallstones are noted the gallbladder, largest measures 6.6 mm. No wall thickening visualized. No sonographic Murphy sign noted by sonographer.  Common bile duct:  Diameter: 2.9 mm.  Liver:  No focal lesion  identified. Increased echotexture. Portal vein is patent on color Doppler imaging with normal direction of blood flow towards the liver.  Other: None.  IMPRESSION: 1. Cholelithiasis without sonographic evidence of acute cholecystitis. 2. Fatty infiltration of the liver.  Assessment and Plan:   Diagnoses and all orders for this visit:  Symptomatic cholelithiasis - ondansetron (ZOFRAN-ODT) 4 MG disintegrating tablet; Take 1 tablet (4 mg total) by mouth every 8 (eight) hours as needed for Nausea for up to 7 days - oxyCODONE (ROXICODONE) 5 MG immediate release tablet; Take 1 tablet (5 mg total) by mouth every 6 (six) hours as needed for Pain for up to 5 days  History of abdominoplasty    Assessment & Plan Cholelithiasis with symptoms Intermittent abdominal spasms likely due to symptomatic cholelithiasis, with severe pain in the upper abdomen radiating to the back, consistent with gallbladder pathology. Risk of cholecystitis if gallstones obstruct the gallbladder neck. Surgical removal of the gallbladder is the best management option. Surgery is minimally invasive, with potential risks including infection, injury to abdominal structures, bile leak, and injury to the common bile duct (rarest complication). Discussed possibility of loose stools post-surgery due to gallbladder removal. Explained the surgical procedure, including incision sites and post-operative care. Engaged in shared decision-making; she expressed a desire to proceed with surgery despite nervousness. Discussed anesthesia risks and post-operative pain management strategies. - Schedule laparoscopic cholecystectomy. - Prescribe Zofran for nausea. - Prescribe opioid for breakthrough pain, limited by state law. - Advise Tylenol 1000 mg every 8 hours for pain management, alternating with ibuprofen, ensuring ibuprofen is taken with food. - Recommend Miralax if constipation is a concern post-surgery. - Discuss work restrictions: no  lifting, pushing, or pulling over 10 pounds for 2 weeks post-surgery. - Advise scheduling 2 weeks off work, with the option to return  earlier if feeling well. - Arrange follow-up appointment 2-3 weeks post-surgery.  I believe the patient's symptoms are consistent with gallbladder disease.  We discussed gallbladder disease. The patient was given Agricultural engineer. We discussed non-operative and operative management. We discussed the signs & symptoms of acute cholecystitis  I discussed laparoscopic cholecystectomy with possible IOC in detail. The patient was given educational material as well as diagrams detailing the procedure. We discussed the risks and benefits of a laparoscopic cholecystectomy including, but not limited to bleeding, infection, injury to surrounding structures such as the intestine or liver, bile leak, retained gallstones, need to convert to an open procedure, prolonged diarrhea, blood clots such as DVT, common bile duct injury, anesthesia risks, and possible need for additional procedures. We discussed the typical post-operative recovery course. I explained that the likelihood of improvement of their symptoms is good.  We did discuss potential issues with intra-abdominal scar tissue from her prior abdominoplasty and mesh.'s unclear if it was intra-abdominal or within the abdominal wall.  Hypothyroidism No changes in management for hypothyroidism during this visit. This note has been created using automated tools and reviewed for accuracy by Jaymir Struble MCADAMS Tiajah Oyster.  This patient encounter took 30 minutes today to perform the following: take history, perform exam, review outside records, interpret imaging, counsel the patient on their diagnosis and document encounter, findings & plan in the EHR  No follow-ups on file.  Roselinda Bahena Veldon German, MD General, Minimally Invasive, & Bariatric Surgery     Electronically signed by Georgeanne King, MD at 01/17/2024 4:56 PM EDT

## 2024-02-04 ENCOUNTER — Encounter (HOSPITAL_COMMUNITY): Payer: Self-pay | Admitting: General Surgery

## 2024-02-06 LAB — SURGICAL PATHOLOGY

## 2024-02-20 ENCOUNTER — Other Ambulatory Visit: Payer: Self-pay | Admitting: Family Medicine

## 2024-03-12 ENCOUNTER — Ambulatory Visit: Payer: Self-pay | Admitting: Family Medicine

## 2024-03-12 ENCOUNTER — Other Ambulatory Visit (INDEPENDENT_AMBULATORY_CARE_PROVIDER_SITE_OTHER)

## 2024-03-12 DIAGNOSIS — E781 Pure hyperglyceridemia: Secondary | ICD-10-CM | POA: Diagnosis not present

## 2024-03-12 LAB — LIPID PANEL
Cholesterol: 235 mg/dL — ABNORMAL HIGH (ref 0–200)
HDL: 75.1 mg/dL (ref >39.00)
LDL Cholesterol: 133 mg/dL — ABNORMAL HIGH (ref 0–99)
NonHDL: 159.48
Total CHOL/HDL Ratio: 3
Triglycerides: 133 mg/dL (ref 0.0–149.0)
VLDL: 26.6 mg/dL (ref 0.0–40.0)

## 2024-03-12 LAB — HEPATIC FUNCTION PANEL
ALT: 15 U/L (ref 0–35)
AST: 13 U/L (ref 0–37)
Albumin: 4 g/dL (ref 3.5–5.2)
Alkaline Phosphatase: 60 U/L (ref 39–117)
Bilirubin, Direct: 0.1 mg/dL (ref 0.0–0.3)
Total Bilirubin: 0.3 mg/dL (ref 0.2–1.2)
Total Protein: 6.8 g/dL (ref 6.0–8.3)

## 2024-05-30 ENCOUNTER — Other Ambulatory Visit: Payer: Self-pay

## 2024-06-11 ENCOUNTER — Other Ambulatory Visit: Payer: Self-pay | Admitting: Family Medicine

## 2024-07-31 ENCOUNTER — Encounter: Payer: Self-pay | Admitting: Family Medicine

## 2024-07-31 ENCOUNTER — Ambulatory Visit: Payer: Self-pay | Admitting: Family Medicine

## 2024-07-31 ENCOUNTER — Ambulatory Visit: Admitting: Family Medicine

## 2024-07-31 VITALS — BP 122/74 | HR 100 | Temp 98.0°F | Resp 16 | Ht 64.0 in | Wt 180.0 lb

## 2024-07-31 DIAGNOSIS — F339 Major depressive disorder, recurrent, unspecified: Secondary | ICD-10-CM | POA: Diagnosis not present

## 2024-07-31 DIAGNOSIS — E039 Hypothyroidism, unspecified: Secondary | ICD-10-CM | POA: Diagnosis not present

## 2024-07-31 LAB — TSH: TSH: 3.69 u[IU]/mL (ref 0.35–5.50)

## 2024-07-31 LAB — T4, FREE: Free T4: 0.58 ng/dL — ABNORMAL LOW (ref 0.60–1.60)

## 2024-07-31 NOTE — Progress Notes (Signed)
 Chief Complaint  Patient presents with   Medication Refill    Medication Check    Subjective Marcia Rodgers presents for f/u anxiety/depression.  Pt is currently being treated with Wellbutrin  XL 300 mg daily.  Reports doing well since treatment. No thoughts of harming self or others. No self-medication with alcohol, prescription drugs or illicit drugs. Pt is not following with a counselor/psychologist.  Hypothyroidism Patient presents for follow-up of hypothyroidism.  Reports compliance with medication-levothyroxine  100 mcg daily. Current symptoms include: denies fatigue, weight changes, heat/cold intolerance, bowel/skin changes or CVS symptoms She believes her dose should be not significantly changed   Past Medical History:  Diagnosis Date   Allergy    Anxiety    Arthritis    Gluten intolerance    Hematuria    Hypothyroidism    Rheumatoid arthritis (HCC)    Allergies as of 07/31/2024       Reactions   Dairycare [bacid] Other (See Comments)   GI upset Dairy*   Citric Acid    AVOIDS ACIDIC FOODS   Gluten Meal Other (See Comments)   rheumatoid arthritis, GI upset    Prednisone Rash        Medication List        Accurate as of July 31, 2024  8:31 AM. If you have any questions, ask your nurse or doctor.          STOP taking these medications    dicyclomine  10 MG capsule Commonly known as: BENTYL  Stopped by: Marcia Rodgers   ibuprofen 200 MG tablet Commonly known as: ADVIL Stopped by: Marcia Rodgers   oxyCODONE  5 MG immediate release tablet Commonly known as: Oxy IR/ROXICODONE  Stopped by: Marcia [place] Rodgers       TAKE these medications    ascorbic acid 500 MG tablet Commonly known as: VITAMIN C Take 500 mg by mouth daily.   buPROPion  300 MG 24 hr tablet Commonly known as: WELLBUTRIN  XL Take 1 tablet (300 mg total) by mouth daily.   levocetirizine 5 MG tablet Commonly known as: XYZAL  Take 1 tablet (5 mg total) by  mouth every evening.   multivitamin with minerals tablet Take 1 tablet by mouth daily.   Marcia Rodgers 3-0.02 MG tablet Generic drug: drospirenone-ethinyl estradiol Take 1 tablet by mouth daily.   NONFORMULARY OR COMPOUNDED ITEM Levothyroxine  SR 100 mcg What changed:  how much to take how to take this when to take this   valACYclovir 1000 MG tablet Commonly known as: VALTREX Take 1,000 mg by mouth daily as needed (break outs).        Exam BP 122/74 (BP Location: Left Arm, Patient Position: Sitting)   Pulse 100   Temp 98 F (36.7 C) (Oral)   Resp 16   Ht 5' 4 (1.626 m)   Wt 180 lb (81.6 kg)   SpO2 98%   BMI 30.90 kg/m  General:  well developed, well nourished, in no apparent distress Heart: RRR, no LE edema Neck: Supple, symmetric, no masses or thyromegaly appreciated Lungs: CTAB.  No respiratory distress Psych: well oriented with normal range of affect and age-appropriate judgement/insight, alert and oriented x4.  Assessment and Plan  Depression, recurrent  Hypothyroidism, unspecified type - Plan: TSH, T4, free  Chronic, stable. Continue Wellbutrin  XL 300 mg daily. Chronic, stable.  Continue levothyroxine  100 mcg daily. F/u in 6 months. The patient voiced understanding and agreement to the plan.  Marcia [place], DO 07/31/24 8:31 AM

## 2024-07-31 NOTE — Patient Instructions (Addendum)
Keep the diet clean and stay active.  Give us 2-3 business days to get the results of your labs back.   Let us know if you need anything.  

## 2024-08-03 ENCOUNTER — Other Ambulatory Visit: Payer: Self-pay

## 2024-08-03 MED ORDER — LEVOTHYROXINE SODIUM 100 MCG PO TABS: 100.0000 ug | ORAL_TABLET | Freq: Every day | ORAL | 3 refills | Status: AC

## 2024-08-03 NOTE — Telephone Encounter (Signed)
 I don't see the RX in her history and not coming up as an option to order, did you want to do different dosages to add to 105?

## 2024-08-06 ENCOUNTER — Other Ambulatory Visit: Payer: Self-pay

## 2024-08-06 MED ORDER — NONFORMULARY OR COMPOUNDED ITEM: 3 refills | Status: AC

## 2024-08-06 NOTE — Addendum Note (Signed)
 Addended by: Garnet Overfield M on: 08/06/2024 10:04 AM   Modules accepted: Orders

## 2025-01-30 ENCOUNTER — Encounter: Admitting: Family Medicine
